# Patient Record
Sex: Male | Born: 1995 | Race: Black or African American | Hispanic: No | Marital: Single | Smoking: Never smoker
Health system: Southern US, Community
[De-identification: ages and names within clinical notes are randomized; demographics above are authoritative.]

## PROBLEM LIST (undated history)

## (undated) DIAGNOSIS — U071 COVID-19: Secondary | ICD-10-CM

## (undated) DIAGNOSIS — T7840XA Allergy, unspecified, initial encounter: Secondary | ICD-10-CM

## (undated) HISTORY — DX: Allergy, unspecified, initial encounter: T78.40XA

---
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
## 2017-03-21 ENCOUNTER — Encounter: Payer: Self-pay | Admitting: Family Medicine

## 2017-03-21 ENCOUNTER — Ambulatory Visit: Payer: Self-pay | Admitting: Family Medicine

## 2017-03-21 VITALS — BP 106/54 | HR 83 | Temp 98.8°F | Resp 17 | Ht 69.5 in | Wt 202.4 lb

## 2017-03-21 DIAGNOSIS — L259 Unspecified contact dermatitis, unspecified cause: Secondary | ICD-10-CM

## 2017-03-21 DIAGNOSIS — Z113 Encounter for screening for infections with a predominantly sexual mode of transmission: Secondary | ICD-10-CM

## 2017-03-21 DIAGNOSIS — Z131 Encounter for screening for diabetes mellitus: Secondary | ICD-10-CM

## 2017-03-21 DIAGNOSIS — Z0000 Encounter for general adult medical examination without abnormal findings: Secondary | ICD-10-CM

## 2017-03-21 DIAGNOSIS — B3742 Candidal balanitis: Secondary | ICD-10-CM

## 2017-03-21 LAB — POCT GLYCOSYLATED HEMOGLOBIN (HGB A1C): Hemoglobin A1C: 5.6

## 2017-03-21 MED ORDER — HYDROCORTISONE 2.5 % EX CREA: TOPICAL_CREAM | Freq: Two times a day (BID) | CUTANEOUS | 0 refills | Status: DC

## 2017-03-21 NOTE — Patient Instructions (Addendum)
   IF you received an x-ray today, you will receive an invoice from [facility]. Please contact [facility] at [phone] with questions or concerns regarding your invoice.   IF you received labwork today, you will receive an invoice from [facility]. Please contact [facility] at [phone] with questions or concerns regarding your invoice.   Our billing staff will not be able to assist you with questions regarding bills from these companies.  You will be contacted with the lab results as soon as they are available. The fastest way to get your results is to activate your My Chart account. Instructions are located on the last page of this paperwork. If you have not heard from us regarding the results in 2 weeks, please contact this office.      Balanitis Balanitis is swelling and irritation (inflammation) of the head of the penis (glans penis). The condition may also cause inflammation of the skin around the glans penis (foreskin) in men who have not been circumcised. It may develop because of an infection or another medical condition. Balanitis occurs most often among men who have not had their foreskin removed (uncircumcised men). Balanitis sometimes causes scarring of the penis or foreskin, which can require surgery. Untreated balanitis can increase the risk of penile cancer. What are the causes? Common causes of this condition include:  Poor personal hygiene, especially in uncircumcised men. Not cleaning the glans penis and foreskin well can result in buildup of bacteria, viruses, and yeast, which can lead to infection and inflammation.  Irritation and lack of air flow due to fluid (smegma) that can build up on the glans penis.  Other causes include:  Chemical irritation from products such as soaps or shower gels (especially those that have fragrance), condoms, personal lubricants, petroleum jelly, spermicides, or fabric softeners.  Skin conditions, such as eczema,  dermatitis, and psoriasis.  Allergies to medicines, such as tetracycline and sulfa drugs.  Certain medical conditions, including liver cirrhosis, congestive heart failure, diabetes, and kidney disease.  Infections, such as candidiasis, HPV (human papillomavirus), herpes simplex, gonorrhea, and syphilis.  Severe obesity.  What increases the risk? The following factors may make you more likely to develop this condition:  Having diabetes. This is the most common risk factor.  Having a tight foreskin that is difficult to pull back (retract) past the glans.  Having sexual intercourse without using a condom.  What are the signs or symptoms? Symptoms of this condition include:  Discharge from under the foreskin.  A bad smell.  Pain or difficulty retracting the foreskin.  Tenderness, redness, and swelling of the glans.  A rash or sores on the glans or foreskin.  Itchiness.  Inability to get an erection due to pain.  Difficulty urinating.  Scarring of the penis or foreskin, in some cases.  How is this diagnosed? This condition may be diagnosed based on:  A physical exam.  Testing a swab of discharge to check for bacterial or fungal infection.  Blood tests: ? To check for viruses that can cause balanitis. ? To check your blood sugar (glucose) level. High blood glucose could be a sign of diabetes, which can cause balanitis.  How is this treated? Treatment for balanitis depends on the cause. Treatment may include:  Improving personal hygiene. Your health care provider may recommend sitting in a bath of warm water that is deep enough to cover your hips and buttocks (sitz bath).  Medicines such as: ? Creams or ointments to reduce swelling (steroids) or   treat an infection. ? Antibiotic medicine. ? Antifungal medicine.  Surgery to remove or cut the foreskin (circumcision). This may be done if you have scarring on the foreskin that makes it difficult to  retract.  Controlling other medical problems that may be causing your condition or making it worse.  Follow these instructions at home:  Do not have sex until the condition clears up, or until your health care provider approves.  Keep your penis clean and dry. Take sitz baths as recommended by your health care provider.  Avoid products that irritate your skin or make symptoms worse, such as soaps and shower gels that have fragrance.  Take over-the-counter and prescription medicines only as told by your health care provider. ? If you were prescribed an antibiotic medicine or a cream or ointment, use it as told by your health care provider. Do not stop using your medicine, cream, or ointment even if you start to feel better. ? Do not drive or use heavy machinery while taking prescription pain medicine. Contact a health care provider if:  Your symptoms get worse or do not improve with home care.  You develop chills or a fever.  You have trouble urinating.  You cannot retract your foreskin. Get help right away if:  You develop severe pain.  You are unable to urinate. Summary  Balanitis is inflammation of the head of the penis (glans penis) caused by irritation or infection.  Balanitis causes pain, redness, and swelling of the glans penis.  This condition is most common among uncircumcised men who do not keep their glans penis clean and in men who have diabetes.  Treatment may include creams or ointments.  Good hygiene is important for prevention. This includes pulling back the foreskin when washing your penis. This information is not intended to replace advice given to you by your health care provider. Make sure you discuss any questions you have with your health care provider. Document Released: 09/17/2008 Document Revised: 03/20/2016 Document Reviewed: 03/20/2016 Elsevier Interactive Patient Education  2017 [employer]  Contact Dermatitis Dermatitis is redness, soreness,  and swelling (inflammation) of the skin. Contact dermatitis is a reaction to certain substances that touch the skin. There are two types of contact dermatitis:  Irritant contact dermatitis. This type is caused by something that irritates your skin, such as dry hands from washing them too much. This type does not require previous exposure to the substance for a reaction to occur. This type is more common.  Allergic contact dermatitis. This type is caused by a substance that you are allergic to, such as a nickel allergy or poison ivy. This type only occurs if you have been exposed to the substance (allergen) before. Upon a repeat exposure, your body reacts to the substance. This type is less common.  What are the causes? Many different substances can cause contact dermatitis. Irritant contact dermatitis is most commonly caused by exposure to:  Makeup.  Soaps.  Detergents.  Bleaches.  Acids.  Metal salts, such as nickel.  Allergic contact dermatitis is most commonly caused by exposure to:  Poisonous plants.  Chemicals.  Jewelry.  Latex.  Medicines.  Preservatives in products, such as clothing.  What increases the risk? This condition is more likely to develop in:  People who have jobs that expose them to irritants or allergens.  People who have certain medical conditions, such as asthma or eczema.  What are the signs or symptoms? Symptoms of this condition may occur anywhere on your body  where the irritant has touched you or is touched by you. Symptoms include:  Dryness or flaking.  Redness.  Cracks.  Itching.  Pain or a burning feeling.  Blisters.  Drainage of small amounts of blood or clear fluid from skin cracks.  With allergic contact dermatitis, there may also be swelling in areas such as the eyelids, mouth, or genitals. How is this diagnosed? This condition is diagnosed with a medical history and physical exam. A patch skin test may be performed to help  determine the cause. If the condition is related to your job, you may need to see an occupational medicine specialist. How is this treated? Treatment for this condition includes figuring out what caused the reaction and protecting your skin from further contact. Treatment may also include:  Steroid creams or ointments. Oral steroid medicines may be needed in more severe cases.  Antibiotics or antibacterial ointments, if a skin infection is present.  Antihistamine lotion or an antihistamine taken by mouth to ease itching.  A bandage (dressing).  Follow these instructions at home: Skin Care  Moisturize your skin as needed.  Apply cool compresses to the affected areas.  Try taking a bath with: ? Epsom salts. Follow the instructions on the packaging. You can get these at your local pharmacy or grocery store. ? Baking soda. Pour a small amount into the bath as directed by your health care provider. ? Colloidal oatmeal. Follow the instructions on the packaging. You can get this at your local pharmacy or grocery store.  Try applying baking soda paste to your skin. Stir water into baking soda until it reaches a paste-like consistency.  Do not scratch your skin.  Bathe less frequently, such as every other day.  Bathe in lukewarm water. Avoid using hot water. Medicines  Take or apply over-the-counter and prescription medicines only as told by your health care provider.  If you were prescribed an antibiotic medicine, take or apply your antibiotic as told by your health care provider. Do not stop using the antibiotic even if your condition starts to improve. General instructions  Keep all follow-up visits as told by your health care provider. This is important.  Avoid the substance that caused your reaction. If you do not know what caused it, keep a journal to try to track what caused it. Write down: ? What you eat. ? What cosmetic products you use. ? What you drink. ? What you wear  in the affected area. This includes jewelry.  If you were given a dressing, take care of it as told by your health care provider. This includes when to change and remove it. Contact a health care provider if:  Your condition does not improve with treatment.  Your condition gets worse.  You have signs of infection such as swelling, tenderness, redness, soreness, or warmth in the affected area.  You have a fever.  You have new symptoms. Get help right away if:  You have a severe headache, neck pain, or neck stiffness.  You vomit.  You feel very sleepy.  You notice red streaks coming from the affected area.  Your bone or joint underneath the affected area becomes painful after the skin has healed.  The affected area turns darker.  You have difficulty breathing. This information is not intended to replace advice given to you by your health care provider. Make sure you discuss any questions you have with your health care provider. Document Released: 04/28/2000 Document Revised: 10/07/2015 Document Reviewed: 09/16/2014 [employer] Interactive  Patient Education  2018 [employer]  

## 2017-03-21 NOTE — Progress Notes (Signed)
Chief Complaint  Patient presents with  . Annual Exam    cpe  . Rash    neck and back x months, has not tried any otc medications for rash  . Edema    head penis around the bottom x 1 week  . family hx of diabetes    wants to be checked for diabetes    Subjective:  Walter Tapia is a 21 y.o. male here for a health maintenance visit.  Patient is new pt  Pt reports that he has increased urination and dry mouth and is concerned about diabetes He reports gaining weight  He also would like std screening  There are no active problems to display for this patient.   Past Medical History:  Diagnosis Date  . Allergy     History reviewed. No pertinent surgical history.   No outpatient medications prior to visit.   No facility-administered medications prior to visit.     No Known Allergies   Family History  Problem Relation Age of Onset  . Diabetes Paternal Grandmother       Social History   Socioeconomic History  . Marital status: Single    Spouse name: Not on file  . Number of children: Not on file  . Years of education: Not on file  . Highest education level: Not on file  Social Needs  . Financial resource strain: Not on file  . Food insecurity - worry: Not on file  . Food insecurity - inability: Not on file  . Transportation needs - medical: Not on file  . Transportation needs - non-medical: Not on file  Occupational History  . Not on file  Tobacco Use  . Smoking status: Never Smoker  . Smokeless tobacco: Never Used  Substance and Sexual Activity  . Alcohol use: No    Frequency: Never  . Drug use: No  . Sexual activity: Yes    Partners: Female    Birth control/protection: Condom  Other Topics Concern  . Not on file  Social History Narrative  . Not on file   Social History   Substance and Sexual Activity  Alcohol Use No  . Frequency: Never   Social History   Tobacco Use  Smoking Status Never Smoker  Smokeless Tobacco Never Used    Social History   Substance and Sexual Activity  Drug Use No    GYN: Sexual Health Menstrual status: regular menses LMP: No LMP for male patient. Last pap smear: see HM section History of abnormal pap smears:  Sexually active: with male partner Current contraception:   Health Maintenance: See under health Maintenance activity for review of completion dates as well.  There is no immunization history on file for this patient.    Depression Screen-PHQ2/9 [facility] screen [facility] 03/21/2017  Decreased Interest 0  Down, Depressed, Hopeless 0  PHQ - 2 Score 0     Depression Severity and Treatment Recommendations:  0-4= None  5-9= Mild / Treatment: Support, educate to call if worse; return in one month  10-14= Moderate / Treatment: Support, watchful waiting; Antidepressant or Psycotherapy  15-19= Moderately severe / Treatment: Antidepressant OR Psychotherapy  >= 20 = Major depression, severe / Antidepressant AND Psychotherapy    Review of Systems   Review of Systems  Constitutional: Negative for chills, fever, malaise/fatigue and weight loss.  Eyes: Negative for blurred vision, double vision, photophobia and pain.  Gastrointestinal: Negative for abdominal pain, nausea and vomiting.  Genitourinary: Negative for dysuria.  Skin:  Positive for itching and rash.  Neurological: Negative for dizziness, tingling and headaches.    All other systems reviewed and were negative   Objective:   Vitals:   03/21/17 1049  BP: (!) 106/54  Pulse: 83  Resp: 17  Temp: 98.8 F (37.1 C)  TempSrc: Oral  SpO2: 98%  Weight: 202 lb 6.4 oz (91.8 kg)  Height: 5' 9.5" (1.765 m)    Body mass index is 29.46 kg/m.  Physical Exam  BP (!) 106/54 (BP Location: Right Arm, Patient Position: Sitting, Cuff Size: Normal)   Pulse 83   Temp 98.8 F (37.1 C) (Oral)   Resp 17   Ht 5' 9.5" (1.765 m)   Wt 202 lb 6.4 oz (91.8 kg)   SpO2 98%   BMI 29.46 kg/m   General Appearance:     Alert, cooperative, no distress, appears stated age  Head:    Normocephalic, without obvious abnormality, atraumatic  Eyes:    PERRL, conjunctiva/corneas clear, EOM's intact, fundi    benign, both eyes       Ears:    Normal TM's and external ear canals, both ears  Nose:   Nares normal, septum midline, mucosa normal, no drainage   or sinus tenderness  Throat:   Lips, mucosa, and tongue normal; teeth and gums normal  Neck:   Supple, symmetrical, trachea midline, no adenopathy;       thyroid:    Lungs:     Clear to auscultation bilaterally, respirations unlabored  Chest wall:    No tenderness or deformity  Heart:    Regular rate and rhythm, S1 and S2 normal, no murmur, rub   or gallop  Abdomen:     Soft, non-tender, bowel sounds active all four quadrants,    no masses, no organomegaly  Genitalia:    Chaperone present. Normal male penis with crusty skin on glans. no discharge or tenderness  Extremities:   Extremities normal, atraumatic, no cyanosis or edema  Pulses:   2+ and symmetric all extremities  Skin:   Skin with hyperpigmentation of the neck and areas of the back  Lymph nodes:   Cervical, supraclavicular, and axillary nodes normal  Neurologic:   CNII-XII intact. Normal strength, sensation and reflexes      throughout       Assessment/Plan:   Patient was seen for a health maintenance exam.  Counseled the patient on health maintenance issues. Reviewed her health mainteance schedule and ordered appropriate tests (see orders.) Counseled on regular exercise and weight management. Recommend regular eye exams and dental cleaning.   The following issues were addressed today for health maintenance:   Walter Tapia was seen today for annual exam, rash, edema and family hx of diabetes.  Diagnoses and all orders for this visit:  Health maintenance examination- age appropriate screenings reviewed  Screening examination for STD (sexually transmitted disease) -     RPR -     GC/Chlamydia  Probe Amp -     HIV antibody  Candidal balanitis  Screening for diabetes mellitus -     POCT glycosylated hemoglobin (Hb A1C)  Contact dermatitis, unspecified contact dermatitis type, unspecified trigger  Other orders -     Cancel: Flu Vaccine QUAD 36+ mos IM -     Cancel: Tdap vaccine greater than or equal to 7yo IM -     hydrocortisone 2.5 % cream; Apply 2 (two) times daily topically.    No Follow-up on file.    Body mass index is 29.46  kg/m.:  Discussed the patient's BMI with patient. The BMI body mass index is 29.46 kg/m.     No future appointments.  Patient Instructions       IF you received an x-ray today, you will receive an invoice from [facility]. Please contact [facility] at [phone] with questions or concerns regarding your invoice.   IF you received labwork today, you will receive an invoice from [facility]. Please contact [facility] at [phone] with questions or concerns regarding your invoice.   Our billing staff will not be able to assist you with questions regarding bills from these companies.  You will be contacted with the lab results as soon as they are available. The fastest way to get your results is to activate your My Chart account. Instructions are located on the last page of this paperwork. If you have not heard from [place] regarding the results in 2 weeks, please contact this office.     Balanitis Balanitis is swelling and irritation (inflammation) of the head of the penis (glans penis). The condition may also cause inflammation of the skin around the glans penis (foreskin) in men who have not been circumcised. It may develop because of an infection or another medical condition. Balanitis occurs most often among men who have not had their foreskin removed (uncircumcised men). Balanitis sometimes causes scarring of the penis or foreskin, which can require surgery. Untreated balanitis can increase the risk of penile  cancer. What are the causes? Common causes of this condition include:  Poor personal hygiene, especially in uncircumcised men. Not cleaning the glans penis and foreskin well can result in buildup of bacteria, viruses, and yeast, which can lead to infection and inflammation.  Irritation and lack of air flow due to fluid (smegma) that can build up on the glans penis.  Other causes include:  Chemical irritation from products such as soaps or shower gels (especially those that have fragrance), condoms, personal lubricants, petroleum jelly, spermicides, or fabric softeners.  Skin conditions, such as eczema, dermatitis, and psoriasis.  Allergies to medicines, such as tetracycline and sulfa drugs.  Certain medical conditions, including liver cirrhosis, congestive heart failure, diabetes, and kidney disease.  Infections, such as candidiasis, HPV (human papillomavirus), herpes simplex, gonorrhea, and syphilis.  Severe obesity.  What increases the risk? The following factors may make you more likely to develop this condition:  Having diabetes. This is the most common risk factor.  Having a tight foreskin that is difficult to pull back (retract) past the glans.  Having sexual intercourse without using a condom.  What are the signs or symptoms? Symptoms of this condition include:  Discharge from under the foreskin.  A bad smell.  Pain or difficulty retracting the foreskin.  Tenderness, redness, and swelling of the glans.  A rash or sores on the glans or foreskin.  Itchiness.  Inability to get an erection due to pain.  Difficulty urinating.  Scarring of the penis or foreskin, in some cases.  How is this diagnosed? This condition may be diagnosed based on:  A physical exam.  Testing a swab of discharge to check for bacterial or fungal infection.  Blood tests: ? To check for viruses that can cause balanitis. ? To check your blood sugar (glucose) level. High blood glucose  could be a sign of diabetes, which can cause balanitis.  How is this treated? Treatment for balanitis depends on the cause. Treatment may include:  Improving personal hygiene. Your health care provider may recommend sitting in a  bath of warm water that is deep enough to cover your hips and buttocks (sitz bath).  Medicines such as: ? Creams or ointments to reduce swelling (steroids) or to treat an infection. ? Antibiotic medicine. ? Antifungal medicine.  Surgery to remove or cut the foreskin (circumcision). This may be done if you have scarring on the foreskin that makes it difficult to retract.  Controlling other medical problems that may be causing your condition or making it worse.  Follow these instructions at home:  Do not have sex until the condition clears up, or until your health care provider approves.  Keep your penis clean and dry. Take sitz baths as recommended by your health care provider.  Avoid products that irritate your skin or make symptoms worse, such as soaps and shower gels that have fragrance.  Take over-the-counter and prescription medicines only as told by your health care provider. ? If you were prescribed an antibiotic medicine or a cream or ointment, use it as told by your health care provider. Do not stop using your medicine, cream, or ointment even if you start to feel better. ? Do not drive or use heavy machinery while taking prescription pain medicine. Contact a health care provider if:  Your symptoms get worse or do not improve with home care.  You develop chills or a fever.  You have trouble urinating.  You cannot retract your foreskin. Get help right away if:  You develop severe pain.  You are unable to urinate. Summary  Balanitis is inflammation of the head of the penis (glans penis) caused by irritation or infection.  Balanitis causes pain, redness, and swelling of the glans penis.  This condition is most common among uncircumcised men  who do not keep their glans penis clean and in men who have diabetes.  Treatment may include creams or ointments.  Good hygiene is important for prevention. This includes pulling back the foreskin when washing your penis. This information is not intended to replace advice given to you by your health care provider. Make sure you discuss any questions you have with your health care provider. Document Released: 09/17/2008 Document Revised: 03/20/2016 Document Reviewed: 03/20/2016 Elsevier Interactive Patient Education  2017 [employer]  Contact Dermatitis Dermatitis is redness, soreness, and swelling (inflammation) of the skin. Contact dermatitis is a reaction to certain substances that touch the skin. There are two types of contact dermatitis:  Irritant contact dermatitis. This type is caused by something that irritates your skin, such as dry hands from washing them too much. This type does not require previous exposure to the substance for a reaction to occur. This type is more common.  Allergic contact dermatitis. This type is caused by a substance that you are allergic to, such as a nickel allergy or poison ivy. This type only occurs if you have been exposed to the substance (allergen) before. Upon a repeat exposure, your body reacts to the substance. This type is less common.  What are the causes? Many different substances can cause contact dermatitis. Irritant contact dermatitis is most commonly caused by exposure to:  Makeup.  Soaps.  Detergents.  Bleaches.  Acids.  Metal salts, such as nickel.  Allergic contact dermatitis is most commonly caused by exposure to:  Poisonous plants.  Chemicals.  Jewelry.  Latex.  Medicines.  Preservatives in products, such as clothing.  What increases the risk? This condition is more likely to develop in:  People who have jobs that expose them to irritants or allergens.  People who have certain medical conditions, such as asthma  or eczema.  What are the signs or symptoms? Symptoms of this condition may occur anywhere on your body where the irritant has touched you or is touched by you. Symptoms include:  Dryness or flaking.  Redness.  Cracks.  Itching.  Pain or a burning feeling.  Blisters.  Drainage of small amounts of blood or clear fluid from skin cracks.  With allergic contact dermatitis, there may also be swelling in areas such as the eyelids, mouth, or genitals. How is this diagnosed? This condition is diagnosed with a medical history and physical exam. A patch skin test may be performed to help determine the cause. If the condition is related to your job, you may need to see an occupational medicine specialist. How is this treated? Treatment for this condition includes figuring out what caused the reaction and protecting your skin from further contact. Treatment may also include:  Steroid creams or ointments. Oral steroid medicines may be needed in more severe cases.  Antibiotics or antibacterial ointments, if a skin infection is present.  Antihistamine lotion or an antihistamine taken by mouth to ease itching.  A bandage (dressing).  Follow these instructions at home: Skin Care  Moisturize your skin as needed.  Apply cool compresses to the affected areas.  Try taking a bath with: ? Epsom salts. Follow the instructions on the packaging. You can get these at your local pharmacy or grocery store. ? Baking soda. Pour a small amount into the bath as directed by your health care provider. ? Colloidal oatmeal. Follow the instructions on the packaging. You can get this at your local pharmacy or grocery store.  Try applying baking soda paste to your skin. Stir water into baking soda until it reaches a paste-like consistency.  Do not scratch your skin.  Bathe less frequently, such as every other day.  Bathe in lukewarm water. Avoid using hot water. Medicines  Take or apply over-the-counter  and prescription medicines only as told by your health care provider.  If you were prescribed an antibiotic medicine, take or apply your antibiotic as told by your health care provider. Do not stop using the antibiotic even if your condition starts to improve. General instructions  Keep all follow-up visits as told by your health care provider. This is important.  Avoid the substance that caused your reaction. If you do not know what caused it, keep a journal to try to track what caused it. Write down: ? What you eat. ? What cosmetic products you use. ? What you drink. ? What you wear in the affected area. This includes jewelry.  If you were given a dressing, take care of it as told by your health care provider. This includes when to change and remove it. Contact a health care provider if:  Your condition does not improve with treatment.  Your condition gets worse.  You have signs of infection such as swelling, tenderness, redness, soreness, or warmth in the affected area.  You have a fever.  You have new symptoms. Get help right away if:  You have a severe headache, neck pain, or neck stiffness.  You vomit.  You feel very sleepy.  You notice red streaks coming from the affected area.  Your bone or joint underneath the affected area becomes painful after the skin has healed.  The affected area turns darker.  You have difficulty breathing. This information is not intended to replace advice given to you by  your health care provider. Make sure you discuss any questions you have with your health care provider. Document Released: 04/28/2000 Document Revised: 10/07/2015 Document Reviewed: 09/16/2014 Elsevier Interactive Patient Education  2018 [employer].

## 2017-03-22 LAB — RPR: RPR: NONREACTIVE

## 2017-03-22 LAB — HIV ANTIBODY (ROUTINE TESTING W REFLEX): HIV SCREEN 4TH GENERATION: NONREACTIVE

## 2017-03-23 LAB — GC/CHLAMYDIA PROBE AMP
CHLAMYDIA, DNA PROBE: NEGATIVE
NEISSERIA GONORRHOEAE BY PCR: NEGATIVE

## 2017-07-02 ENCOUNTER — Encounter: Payer: Self-pay | Admitting: Family Medicine

## 2017-08-09 ENCOUNTER — Telehealth: Payer: Self-pay | Admitting: Family Medicine

## 2017-08-09 MED ORDER — HYDROCORTISONE 2.5 % EX CREA: TOPICAL_CREAM | Freq: Two times a day (BID) | CUTANEOUS | 0 refills | Status: DC

## 2017-08-09 NOTE — Telephone Encounter (Signed)
Copied from CRM [phone]. Topic: Quick Communication - Rx Refill/Question >> Aug 09, 2017 12:03 PM Jonette EvaBarksdale, Harvey B wrote: Pt states he still has the condition prescribed for the cream   Medication: hydrocortisone 2.5 % cream [[id number]][[id number]] Has the patient contacted their pharmacy? No. (Agent: If no, request that the patient contact the pharmacy for the refill.) Preferred Pharmacy (with phone number or street name): [facility] Agent: Please be advised that RX refills may take up to 3 business days. We ask that you follow-up with your pharmacy.

## 2017-08-09 NOTE — Telephone Encounter (Signed)
Hydrocortisone 2.5 % refill Last OV: 03/31/17 Last Refill:03/31/17 Pharmacy:[facility] [address] [place] PCP: Collie SiadZoe Stallings MD

## 2018-09-17 ENCOUNTER — Emergency Department (HOSPITAL_COMMUNITY)
Admission: EM | Admit: 2018-09-17 | Discharge: 2018-09-17 | Disposition: A | Discharge status: Home / Self Care | Payer: Self-pay | Attending: Emergency Medicine | Admitting: Emergency Medicine

## 2018-09-17 ENCOUNTER — Other Ambulatory Visit: Payer: Self-pay

## 2018-09-17 ENCOUNTER — Encounter (HOSPITAL_COMMUNITY): Payer: Self-pay | Admitting: Emergency Medicine

## 2018-09-17 ENCOUNTER — Emergency Department (HOSPITAL_COMMUNITY): Payer: Self-pay

## 2018-09-17 DIAGNOSIS — R0789 Other chest pain: Secondary | ICD-10-CM | POA: Insufficient documentation

## 2018-09-17 LAB — CBC
HCT: 42.3 % (ref 39.0–52.0)
Hemoglobin: 14.3 g/dL (ref 13.0–17.0)
MCH: 30.2 pg (ref 26.0–34.0)
MCHC: 33.8 g/dL (ref 30.0–36.0)
MCV: 89.4 fL (ref 80.0–100.0)
Platelets: 294 10*3/uL (ref 150–400)
RBC: 4.73 MIL/uL (ref 4.22–5.81)
RDW: 11.7 % (ref 11.5–15.5)
WBC: 12.7 10*3/uL — ABNORMAL HIGH (ref 4.0–10.5)
nRBC: 0 % (ref 0.0–0.2)

## 2018-09-17 LAB — TROPONIN I: Troponin I: <0.03 ng/mL (ref <0.03)

## 2018-09-17 LAB — BASIC METABOLIC PANEL
Anion gap: 10 (ref 5–15)
BUN: 12 mg/dL (ref 6–20)
CO2: 26 mmol/L (ref 22–32)
Calcium: 9.6 mg/dL (ref 8.9–10.3)
Chloride: 100 mmol/L (ref 98–111)
Creatinine, Ser: 0.98 mg/dL (ref 0.61–1.24)
GFR calc Af Amer: >60 mL/min (ref >60)
GFR calc non Af Amer: >60 mL/min (ref >60)
Glucose, Bld: 139 mg/dL — ABNORMAL HIGH (ref 70–99)
Potassium: 3.7 mmol/L (ref 3.5–5.1)
Sodium: 136 mmol/L (ref 135–145)

## 2018-09-17 MED ORDER — NAPROXEN 500 MG PO TABS: ORAL_TABLET | ORAL | 0 refills | Status: DC

## 2018-09-17 MED ORDER — HYDROCODONE-ACETAMINOPHEN 5-325 MG PO TABS
1.0000 | ORAL_TABLET | Freq: Once | ORAL | Status: AC
  Administered 2018-09-17: 1 via ORAL
  Filled 2018-09-17: qty 1

## 2018-09-17 MED ORDER — SODIUM CHLORIDE 0.9% FLUSH
3.0000 mL | Freq: Once | INTRAVENOUS | Status: AC
  Administered 2018-09-17: 3 mL via INTRAVENOUS

## 2018-09-17 NOTE — ED Triage Notes (Signed)
Pt state he started having chest pain about an hour ago after getting off of work. Pt states he could be having a panic attach. Pt states his chest pain feels like pressure.

## 2018-09-17 NOTE — ED Provider Notes (Signed)
[facility]ENCY DEPARTMENT Provider Note   CSN: [id number] Arrival date & time: 09/17/18  0135    History   Chief Complaint Chief Complaint  Patient presents with  . Chest Pain    HPI Walter Tapia is a 23 y.o. male.     Patient complains of right-sided chest discomfort  The history is provided by the patient. No language interpreter was used.  Chest Pain  Pain location:  R chest Pain radiates to:  Does not radiate Pain severity:  Moderate Onset quality:  Sudden Timing:  Constant Progression:  Worsening Chronicity:  New Context: not breathing   Relieved by:  Nothing Worsened by:  Nothing Associated symptoms: no abdominal pain, no back pain, no cough, no fatigue and no headache     Past Medical History:  Diagnosis Date  . Allergy     There are no active problems to display for this patient.   History reviewed. No pertinent surgical history.      Home Medications    Prior to Admission medications   Medication Sig Start Date End Date Taking? Authorizing Provider  hydrocortisone 2.5 % cream Apply topically 2 (two) times daily. 08/09/17   Doristine BosworthStallings, Zoe A, MD  naproxen (NAPROSYN) 500 MG tablet Take 1 pill twice a day as needed for pain 09/17/18   Bethann BerkshireZammit, Colston Pyle, MD    Family History Family History  Problem Relation Age of Onset  . Diabetes Paternal Grandmother     Social History Social History   Tobacco Use  . Smoking status: Never Smoker  . Smokeless tobacco: Never Used  Substance Use Topics  . Alcohol use: No    Frequency: Never  . Drug use: No     Allergies   Patient has no known allergies.   Review of Systems Review of Systems  Constitutional: Negative for appetite change and fatigue.  HENT: Negative for congestion, ear discharge and sinus pressure.   Eyes: Negative for discharge.  Respiratory: Negative for cough.   Cardiovascular: Positive for chest pain.  Gastrointestinal: Negative for abdominal pain and diarrhea.   Genitourinary: Negative for frequency and hematuria.  Musculoskeletal: Negative for back pain.  Skin: Negative for rash.  Neurological: Negative for seizures and headaches.  Psychiatric/Behavioral: Negative for hallucinations.     Physical Exam Updated Vital Signs BP (!) 121/53   Pulse 78   Temp 99 F (37.2 C) (Oral)   Resp 19   Ht 5\' 10"  (1.778 m)   Wt 88 kg   SpO2 98%   BMI 27.84 kg/m   Physical Exam Vitals signs and nursing note reviewed.  Constitutional:      Appearance: He is well-developed.  HENT:     Head: Normocephalic.     Nose: Nose normal.     Mouth/Throat:     Mouth: Mucous membranes are moist.  Eyes:     General: No scleral icterus.    Conjunctiva/sclera: Conjunctivae normal.  Neck:     Musculoskeletal: Neck supple.     Thyroid: No thyromegaly.  Cardiovascular:     Rate and Rhythm: Normal rate and regular rhythm.     Heart sounds: No murmur. No friction rub. No gallop.   Pulmonary:     Breath sounds: No stridor. No wheezing or rales.  Chest:     Chest wall: Tenderness present.  Abdominal:     General: There is no distension.     Tenderness: There is no abdominal tenderness. There is no rebound.  Musculoskeletal: Normal range of  motion.  Lymphadenopathy:     Cervical: No cervical adenopathy.  Skin:    Findings: No erythema or rash.  Neurological:     Mental Status: He is oriented to person, place, and time.     Motor: No abnormal muscle tone.     Coordination: Coordination normal.  Psychiatric:        Behavior: Behavior normal.      ED Treatments / Results  Labs (all labs ordered are listed, but only abnormal results are displayed) Labs Reviewed  BASIC METABOLIC PANEL - Abnormal; Notable for the following components:      Result Value   Glucose, Bld 139 (*)    All other components within normal limits  CBC - Abnormal; Notable for the following components:   WBC 12.7 (*)    All other components within normal limits  TROPONIN I     EKG None  Radiology Dg Chest 2 View  Result Date: 09/17/2018 CLINICAL DATA:  23 y/o M; right-sided chest pain that began after work [employer]. Occasional cough. EXAM: CHEST - 2 VIEW COMPARISON:  10/30/2005 FINDINGS: Stable heart size and mediastinal contours are within normal limits. Both lungs are clear. The visualized skeletal structures are unremarkable. IMPRESSION: No active cardiopulmonary disease. Electronically Signed   By: Mitzi Hansen M.D.   On: 09/17/2018 02:27    Procedures Procedures (including critical care time)  Medications Ordered in ED Medications  sodium chloride flush (NS) 0.9 % injection 3 mL (3 mLs Intravenous Given 09/17/18 0204)  HYDROcodone-acetaminophen (NORCO/VICODIN) 5-325 MG per tablet 1 tablet (1 tablet Oral Given 09/17/18 0226)     Initial Impression / Assessment and Plan / ED Course  I have reviewed the triage vital signs and the nursing notes.  Pertinent labs & imaging results that were available during my care of the patient were reviewed by me and considered in my medical decision making (see chart for details).        Labs and EKG chest x-ray unremarkable.  Suspect musculoskeletal pain.  Patient given Naprosyn will follow-up as needed  Final Clinical Impressions(s) / ED Diagnoses   Final diagnoses:  Atypical chest pain    ED Discharge Orders         Ordered    naproxen (NAPROSYN) 500 MG tablet     09/17/18 0258           Bethann Berkshire, MD 09/17/18 0301

## 2018-09-17 NOTE — Discharge Instructions (Addendum)
Follow up next week if not improving. °

## 2018-09-30 ENCOUNTER — Ambulatory Visit
Admission: EM | Admit: 2018-09-30 | Discharge: 2018-09-30 | Disposition: A | Discharge status: Home / Self Care | Payer: Self-pay | Attending: Emergency Medicine | Admitting: Emergency Medicine

## 2018-09-30 ENCOUNTER — Other Ambulatory Visit: Payer: Self-pay

## 2018-09-30 DIAGNOSIS — N481 Balanitis: Secondary | ICD-10-CM

## 2018-09-30 DIAGNOSIS — R0789 Other chest pain: Secondary | ICD-10-CM

## 2018-09-30 MED ORDER — KETOROLAC TROMETHAMINE 60 MG/2ML IM SOLN
60.0000 mg | Freq: Once | INTRAMUSCULAR | Status: AC
  Administered 2018-09-30: 60 mg via INTRAMUSCULAR

## 2018-09-30 MED ORDER — HYDROCORTISONE 2.5 % EX CREA: TOPICAL_CREAM | Freq: Two times a day (BID) | CUTANEOUS | 0 refills | Status: DC

## 2018-09-30 MED ORDER — MELOXICAM 7.5 MG PO TABS: 7.5000 mg | ORAL_TABLET | Freq: Every day | ORAL | 0 refills | Status: DC

## 2018-09-30 NOTE — ED Provider Notes (Signed)
[facility]   [id number] 09/30/18 Arrival Time: 1243   CC: CHEST discomfort  SUBJECTIVE:  Grant RutsDamahli N Deleeuw is a 23 y.o. male who presents with complaint of abrupt chest discomfort that began 2 weeks ago.  Denies a precipitating event, trauma, recent lower respiratory tract, or strenuous upper body activities.  Localizes chest discomfort to the substernal region.  Describes as stable, that is constant and achy in character.  Rates pain as 7/10.   Has tried naproxen and z-pak without relief.  Symptoms made worse with deep breath, and pectoralis stretch.  Denies radiating symptoms.  Denies previous symptoms in the past.  Mother mentioned concern for anxiety, but patient denies anxiety, or recent stressors.  Denies fever, chills, lightheadedness, dizziness, palpitations, tachycardia, SOB, nausea, vomiting, abdominal pain, changes in bowel or bladder habits, diaphoresis, numbness/tingling in extremities, peripheral edema.    Denies SOB, calf pain or swelling, recent long travel, recent surgery, malignancy, tobacco use, hormone use, or previous blood clot  Denies close relatives with cardiac hx  Requests hydrocortisone cream refill for balanitis.    ROS: As per HPI. Past Medical History:  Diagnosis Date  . Allergy    History reviewed. No pertinent surgical history. No Known Allergies No current facility-administered medications on file prior to encounter.    No current outpatient medications on file prior to encounter.   Social History   Socioeconomic History  . Marital status: Single    Spouse name: Not on file  . Number of children: Not on file  . Years of education: Not on file  . Highest education level: Not on file  Occupational History  . Not on file  Social Needs  . Financial resource strain: Not on file  . Food insecurity:    Worry: Not on file    Inability: Not on file  . Transportation needs:    Medical: Not on file    Non-medical: Not on file  Tobacco  Use  . Smoking status: Never Smoker  . Smokeless tobacco: Never Used  Substance and Sexual Activity  . Alcohol use: No    Frequency: Never  . Drug use: No  . Sexual activity: Yes    Partners: Female    Birth control/protection: Condom  Lifestyle  . Physical activity:    Days per week: Not on file    Minutes per session: Not on file  . Stress: Not on file  Relationships  . Social connections:    Talks on phone: Not on file    Gets together: Not on file    Attends religious service: Not on file    Active member of club or organization: Not on file    Attends meetings of clubs or organizations: Not on file    Relationship status: Not on file  . Intimate partner violence:    Fear of current or ex partner: Not on file    Emotionally abused: Not on file    Physically abused: Not on file    Forced sexual activity: Not on file  Other Topics Concern  . Not on file  Social History Narrative  . Not on file   Family History  Problem Relation Age of Onset  . Diabetes Paternal Grandmother      OBJECTIVE:  Vitals:   09/30/18 1252  BP: 134/83  Pulse: 75  Resp: 18  Temp: 99.4 F (37.4 C)  TempSrc: Oral  SpO2: 97%    General appearance: alert; no distress Eyes: PERRLA; EOMI; conjunctiva normal HENT:  normocephalic; atraumatic; EACs mildly ceruminous, TMs pearly gray; nares patent without rhinorrhea; oropharynx clear, tonsils not enlarged or erythematous, uvula midline Neck: supple Lungs: clear to auscultation bilaterally without adventitious breath sounds Heart: regular rate and rhythm.  Clear S1 and S2 without rubs, gallops, or murmur. Chest Wall: No heaves, lifts or thrills; TTP over sternum, and with AP compression of chest Abdomen: soft, non-tender; bowel sounds normal; no guarding Extremities: no cyanosis or edema; symmetrical with no gross deformities Skin: warm and dry Psychological: alert and cooperative; normal mood and flat affect  Dg Chest 2 View  Result Date:  09/17/2018 CLINICAL DATA:  23 y/o M; right-sided chest pain that began after work [employer]. Occasional cough. EXAM: CHEST - 2 VIEW COMPARISON:  10/30/2005 FINDINGS: Stable heart size and mediastinal contours are within normal limits. Both lungs are clear. The visualized skeletal structures are unremarkable. IMPRESSION: No active cardiopulmonary disease. Electronically Signed   By: Mitzi Hansen M.D.   On: 09/17/2018 02:27   ASSESSMENT & PLAN:  1. Chest wall pain   2. Balanitis     Meds ordered this encounter  Medications  . ketorolac (TORADOL) injection 60 mg  . hydrocortisone 2.5 % cream    Sig: Apply topically 2 (two) times daily.    Dispense:  30 g    Refill:  0    Order Specific Question:   Supervising Provider    Answer:   Eustace Moore [[id number]]  . meloxicam (MOBIC) 7.5 MG tablet    Sig: Take 1 tablet (7.5 mg total) by mouth daily.    Dispense:  30 tablet    Refill:  0    Order Specific Question:   Supervising Provider    Answer:   Eustace Moore [[id number]]    Chest x-ray and EKG without abnormal findings on 09/17/2018 Declines repeat chest x-ray today Declines further work-up in the ED at this time.  Patient aware we do not have EKG in office, and are unable to rule out cardiac cause at this time. Would like to try toradol shot in office, and prescription for mobic. Mobic prescribed.  Take as directed.  DO NOT TAKE WITH other antiinflammatories as this may cause GI upset and/or bleed Please make an appointment with a local PCP for further evaluation and management of chest discomfort Return or go to the ED if you have any new or worsening symptoms such as fever, chills, nausea, vomiting, worsening chest discomfort, shortness of breath, lightheadedness, dizziness, vision changes, headaches, abdominal pain, changes in bowel or bladder function, numbness or tingling in arms or legs, weakness in arms or legs, slurred speech, facial droop, etc...    Hydrocortisone  cream refilled.    Chest pain precautions given. Reviewed expectations re: course of current medical issues. Questions answered. Outlined signs and symptoms indicating need for more acute intervention. Patient verbalized understanding. After Visit Summary given.   Rennis Harding, PA-C 09/30/18 1329

## 2018-09-30 NOTE — Discharge Instructions (Addendum)
Chest x-ray and EKG without abnormal findings on 09/17/2018 Declines repeat chest x-ray today Declines further work-up in the ED at this time.  Patient aware we do not have EKG in office, and are unable to rule out cardiac cause at this time. Would like to try toradol shot in office, and prescription for mobic. Mobic prescribed.  Take as directed.  DO NOT TAKE WITH other antiinflammatories as this may cause GI upset and/or bleed Please make an appointment with a local PCP for further evaluation and management of chest discomfort Return or go to the ED if you have any new or worsening symptoms such as fever, chills, nausea, vomiting, worsening chest discomfort, shortness of breath, lightheadedness, dizziness, vision changes, headaches, abdominal pain, changes in bowel or bladder function, numbness or tingling in arms or legs, weakness in arms or legs, slurred speech, facial droop, etc...    Hydrocortisone cream refilled.

## 2018-09-30 NOTE — ED Triage Notes (Signed)
Pt states he has had ongoing upper chest pain, was also seen at urgent care on [address] last week and has just completed z pack

## 2018-11-21 ENCOUNTER — Emergency Department (HOSPITAL_COMMUNITY)
Admission: EM | Admit: 2018-11-21 | Discharge: 2018-11-21 | Disposition: A | Discharge status: Home / Self Care | Payer: BC Managed Care – PPO | Attending: Emergency Medicine | Admitting: Emergency Medicine

## 2018-11-21 ENCOUNTER — Other Ambulatory Visit: Payer: Self-pay

## 2018-11-21 ENCOUNTER — Emergency Department (HOSPITAL_COMMUNITY): Payer: BC Managed Care – PPO

## 2018-11-21 DIAGNOSIS — R079 Chest pain, unspecified: Secondary | ICD-10-CM | POA: Diagnosis present

## 2018-11-21 DIAGNOSIS — Z79899 Other long term (current) drug therapy: Secondary | ICD-10-CM | POA: Diagnosis not present

## 2018-11-21 LAB — TROPONIN I (HIGH SENSITIVITY)
Troponin I (High Sensitivity): <2 ng/L (ref <18)
Troponin I (High Sensitivity): <2 ng/L (ref <18)

## 2018-11-21 NOTE — ED Triage Notes (Signed)
Pt here with c/o center chest pain with cough , no fever no n/v/sob

## 2018-11-21 NOTE — ED Notes (Signed)
Pt given discharge instructions, follow up information, and the opportunity to ask questions. Pt verbalized understanding. Pt discharged from the ED without incident.

## 2018-11-21 NOTE — ED Provider Notes (Signed)
[facility] EMERGENCY DEPARTMENT Provider Note   CSN: [id number] Arrival date & time: 11/21/18  1032    History   Chief Complaint No chief complaint on file.   HPI Walter Tapia is a 23 y.o. male.     HPI    23 year old male presents today with complaints of chest pain.  Patient notes undetermined time between months and weeks of left sharp chest pain.  He notes this is not present every day but has been present over the last several days consistently.  He notes the pain slightly radiates to his left back.  He notes symptoms are not worsened with deep inspiration, palpation or movement.  He denies any associated shortness of breath, fever, cough.  He denies any personal cardiac or pulmonary history.  He denies any family cardiac history.  He is not a smoker.  No history DVT or PE or any significant risk factors for this.  Medications prior to arrival.  No lower extremity swelling or edema.   Past Medical History:  Diagnosis Date  . Allergy     There are no active problems to display for this patient.   No past surgical history on file.     Home Medications    Prior to Admission medications   Medication Sig Start Date End Date Taking? Authorizing Provider  levocetirizine (XYZAL) 5 MG tablet Take 5 mg by mouth every evening.   Yes [provider]  hydrocortisone 2.5 % cream Apply topically 2 (two) times daily. Patient not taking: Reported on 11/21/2018 09/30/18   Wurst, [place], PA-C  meloxicam (MOBIC) 7.5 MG tablet Take 1 tablet (7.5 mg total) by mouth daily. Patient not taking: Reported on 11/21/2018 09/30/18   Lestine Box, PA-C    Family History Family History  Problem Relation Age of Onset  . Diabetes Paternal Grandmother     Social History Social History   Tobacco Use  . Smoking status: Never Smoker  . Smokeless tobacco: Never Used  Substance Use Topics  . Alcohol use: No    Frequency: Never  . Drug use: No     Allergies    Patient has no known allergies.   Review of Systems Review of Systems  All other systems reviewed and are negative.   Physical Exam Updated Vital Signs BP 131/75   Pulse (!) 57   Temp 98.8 F (37.1 C) (Oral)   Resp 16   SpO2 100%   Physical Exam Vitals signs and nursing note reviewed.  Constitutional:      Appearance: He is well-developed.  HENT:     Head: Normocephalic and atraumatic.  Eyes:     General: No scleral icterus.       Right eye: No discharge.        Left eye: No discharge.     Conjunctiva/sclera: Conjunctivae normal.     Pupils: Pupils are equal, round, and reactive to light.  Neck:     Musculoskeletal: Normal range of motion.     Vascular: No JVD.     Trachea: No tracheal deviation.  Cardiovascular:     Rate and Rhythm: Normal rate and regular rhythm.  Pulmonary:     Effort: Pulmonary effort is normal. No respiratory distress.     Breath sounds: Normal breath sounds. No stridor. No wheezing, rhonchi or rales.  Chest:     Chest wall: No tenderness.  Musculoskeletal:     Comments: No edema  Neurological:     Mental Status: He  is alert and oriented to person, place, and time.     Coordination: Coordination normal.  Psychiatric:        Behavior: Behavior normal.        Thought Content: Thought content normal.        Judgment: Judgment normal.     ED Treatments / Results  Labs (all labs ordered are listed, but only abnormal results are displayed) Labs Reviewed  TROPONIN I (HIGH SENSITIVITY)  TROPONIN I (HIGH SENSITIVITY)    EKG EKG Interpretation  Date/Time:  Thursday November 21 2018 10:45:34 EDT Ventricular Rate:  74 PR Interval:  146 QRS Duration: 102 QT Interval:  368 QTC Calculation: 408 R Axis:   35 Text Interpretation:  Normal sinus rhythm RSR' or QR pattern in V1 suggests right ventricular conduction delay Borderline ECG When compared to prior, no sighnificnat changes seen.  NO STEMI Confirmed by Theda Belfastegeler, Chris ([id number]) on 11/21/2018  12:44:59 PM   Radiology No results found.  Procedures Procedures (including critical care time)  Medications Ordered in ED Medications - No data to display   Initial Impression / Assessment and Plan / ED Course  I have reviewed the triage vital signs and the nursing notes.  Pertinent labs & imaging results that were available during my care of the patient were reviewed by me and considered in my medical decision making (see chart for details).          Assessment/Plan: 23 year old male presents today with nonspecific chest pain.  His etiology is unclear I have low suspicion for ACS.  I will evaluate with EKG chest x-ray and troponin.  His EKG shows no significant abnormalities consistent with ACS.  He has no signs signs of cardiac dysfunction, no signs of infectious etiology.  Patient referred to cardiology for outpatient evaluation, he is given strict return precautions, he verbalized understanding and agreement to today's plan had no further questions or concerns at time of discharge.   Final Clinical Impressions(s) / ED Diagnoses   Final diagnoses:  Chest pain, unspecified type    ED Discharge Orders    None       Rosalio LoudHedges, Ingrid Shifrin, PA-C 11/25/18 1932    Tegeler, Canary Brimhristopher J, MD 11/26/18 [phone]

## 2018-11-21 NOTE — ED Notes (Signed)
Patient transported to X-ray 

## 2018-11-21 NOTE — Discharge Instructions (Addendum)
Please read attached information. If you experience any new or worsening signs or symptoms please return to the emergency room for evaluation. Please follow-up with your primary care provider or specialist as discussed.  °

## 2018-11-28 ENCOUNTER — Other Ambulatory Visit: Payer: Self-pay

## 2018-11-28 ENCOUNTER — Other Ambulatory Visit: Payer: BC Managed Care – PPO

## 2018-11-28 DIAGNOSIS — Z20822 Contact with and (suspected) exposure to covid-19: Secondary | ICD-10-CM

## 2018-12-03 LAB — NOVEL CORONAVIRUS, NAA: SARS-CoV-2, NAA: NOT DETECTED

## 2018-12-04 ENCOUNTER — Telehealth: Payer: Self-pay | Admitting: General Practice

## 2018-12-04 NOTE — Telephone Encounter (Signed)
Patient called in for the results of his Covid-19 test.  He was told Covid was Not Detected. °

## 2019-05-02 ENCOUNTER — Ambulatory Visit
Admission: EM | Admit: 2019-05-02 | Discharge: 2019-05-02 | Disposition: A | Discharge status: Home / Self Care | Payer: BC Managed Care – PPO | Attending: Emergency Medicine | Admitting: Emergency Medicine

## 2019-05-02 DIAGNOSIS — U071 COVID-19: Secondary | ICD-10-CM | POA: Diagnosis not present

## 2019-05-02 LAB — POC SARS CORONAVIRUS 2 AG -  ED: SARS Coronavirus 2 Ag: POSITIVE — AB

## 2019-05-02 NOTE — ED Triage Notes (Signed)
Pt here for covid test after positive exposure 2 days ago at work

## 2019-05-02 NOTE — ED Provider Notes (Signed)
[facility]    CSN: [id number] Arrival date & time: 05/02/19  1745      History   Chief Complaint No chief complaint on file.   HPI Walter Tapia is a 23 y.o. male.   Walter Tapia 23 years old male presented to the urgent care with complaint of Covid exposure.  He was exposed to Covid positive person at work 2 days ago.  Denies sick exposure to flu or strep.  Denies recent travel.  Denies aggravating or alleviating symptoms.  Denies previous COVID infection.   Denies fever, chills, fatigue, nasal congestion, rhinorrhea, sore throat, cough, SOB, wheezing, chest pain, nausea, vomiting, changes in bowel or bladder habits.    The history is provided by the patient. No language interpreter was used.    Past Medical History:  Diagnosis Date  . Allergy     There are no problems to display for this patient.   History reviewed. No pertinent surgical history.     Home Medications    Prior to Admission medications   Medication Sig Start Date End Date Taking? Authorizing Provider  hydrocortisone 2.5 % cream Apply topically 2 (two) times daily. Patient not taking: Reported on 11/21/2018 09/30/18   Wurst, [place], PA-C  levocetirizine (XYZAL) 5 MG tablet Take 5 mg by mouth every evening.    [provider]  meloxicam (MOBIC) 7.5 MG tablet Take 1 tablet (7.5 mg total) by mouth daily. Patient not taking: Reported on 11/21/2018 09/30/18   Lestine Box, PA-C    Family History Family History  Problem Relation Age of Onset  . Diabetes Paternal Grandmother     Social History Social History   Tobacco Use  . Smoking status: Never Smoker  . Smokeless tobacco: Never Used  Substance Use Topics  . Alcohol use: No  . Drug use: No     Allergies   Patient has no known allergies.   Review of Systems Review of Systems  Constitutional: Negative.   HENT: Negative.   Respiratory: Negative.   Cardiovascular: Negative.   Gastrointestinal: Negative.     Neurological: Negative.   ROS: All other are negatives   Physical Exam Triage Vital Signs ED Triage Vitals  Enc Vitals Group     BP      Pulse      Resp      Temp      Temp src      SpO2      Weight      Height      Head Circumference      Peak Flow      Pain Score      Pain Loc      Pain Edu?      Excl. in [place]?    No data found.  Updated Vital Signs BP 128/75   Pulse 99   Temp (!) 100.7 F (38.2 C)   Resp 18   SpO2 93%   Visual Acuity Right Eye Distance:   Left Eye Distance:   Bilateral Distance:    Right Eye Near:   Left Eye Near:    Bilateral Near:     Physical Exam Vitals and nursing note reviewed.  Constitutional:      General: He is not in acute distress.    Appearance: Normal appearance. He is normal weight. He is not ill-appearing or toxic-appearing.  HENT:     Head: Normocephalic.     Right Ear: Tympanic membrane, ear canal and external ear normal.  There is no impacted cerumen.     Left Ear: Ear canal and external ear normal. There is no impacted cerumen.     Nose: Nose normal. No congestion.     Mouth/Throat:     Mouth: Mucous membranes are moist.     Pharynx: No oropharyngeal exudate or posterior oropharyngeal erythema.  Cardiovascular:     Rate and Rhythm: Normal rate and regular rhythm.     Pulses: Normal pulses.     Heart sounds: Normal heart sounds. No murmur.  Pulmonary:     Effort: Pulmonary effort is normal. No respiratory distress.     Breath sounds: No wheezing.  Chest:     Chest wall: No tenderness.  Abdominal:     General: Abdomen is flat. Bowel sounds are normal. There is no distension.     Palpations: There is no mass.  Skin:    Capillary Refill: Capillary refill takes less than 2 seconds.  Neurological:     Mental Status: He is alert and oriented to person, place, and time.      UC Treatments / Results  Labs (all labs ordered are listed, but only abnormal results are displayed) Labs Reviewed  POC SARS CORONAVIRUS  2 AG -  ED - Abnormal; Notable for the following components:      Result Value   SARS Coronavirus 2 Ag Positive (*)    All other components within normal limits  NOVEL CORONAVIRUS, NAA    EKG   Radiology No results found.  Procedures Procedures (including critical care time)  Medications Ordered in UC Medications - No data to display  Initial Impression / Assessment and Plan / UC Course  I have reviewed the triage vital signs and the nursing notes.  Pertinent labs & imaging results that were available during my care of the patient were reviewed by me and considered in my medical decision making (see chart for details).    Patient stable for discharge.  Benign physical exam.  Point-of-care COVID-19 test was positive.  Patient was advised to quarantine.  and to use OTC ibuprofen or Tylenol to manage pain and fever.  To return for worsening of symptoms or go to ED.   Final Clinical Impressions(s) / UC Diagnoses   Final diagnoses:  COVID-19 virus infection     Discharge Instructions     Your Rapid COVID-19 test was positive You should remain isolated in your home for 10 days from symptom onset  Get plenty of rest and push fluids Use medications daily for symptom relief Use OTC medications like ibuprofen or tylenol as needed fever or pain Call or go to the ED if you have any new or worsening symptoms such as fever, worsening cough, shortness of breath, chest tightness, chest pain, turning blue, changes in mental status, etc...     ED Prescriptions    None     PDMP not reviewed this encounter.   Durward Parcel, FNP 05/02/19 1843

## 2019-05-02 NOTE — Discharge Instructions (Addendum)
Your Rapid COVID-19 test was positive You should remain isolated in your home for 10 days from symptom onset  Get plenty of rest and push fluids Use medications daily for symptom relief Use OTC medications like ibuprofen or tylenol as needed fever or pain Call or go to the ED if you have any new or worsening symptoms such as fever, worsening cough, shortness of breath, chest tightness, chest pain, turning blue, changes in mental status, etc..[name]

## 2019-05-06 ENCOUNTER — Emergency Department (HOSPITAL_COMMUNITY): Payer: Self-pay

## 2019-05-06 ENCOUNTER — Other Ambulatory Visit: Payer: Self-pay

## 2019-05-06 ENCOUNTER — Emergency Department (HOSPITAL_COMMUNITY)
Admission: EM | Admit: 2019-05-06 | Discharge: 2019-05-07 | Disposition: A | Discharge status: Home / Self Care | Payer: Self-pay | Attending: Emergency Medicine | Admitting: Emergency Medicine

## 2019-05-06 ENCOUNTER — Encounter (HOSPITAL_COMMUNITY): Payer: Self-pay | Admitting: Emergency Medicine

## 2019-05-06 DIAGNOSIS — R519 Headache, unspecified: Secondary | ICD-10-CM | POA: Insufficient documentation

## 2019-05-06 DIAGNOSIS — R509 Fever, unspecified: Secondary | ICD-10-CM | POA: Insufficient documentation

## 2019-05-06 DIAGNOSIS — R197 Diarrhea, unspecified: Secondary | ICD-10-CM | POA: Insufficient documentation

## 2019-05-06 DIAGNOSIS — U071 COVID-19: Secondary | ICD-10-CM | POA: Insufficient documentation

## 2019-05-06 HISTORY — DX: COVID-19: U07.1

## 2019-05-06 LAB — URINALYSIS, ROUTINE W REFLEX MICROSCOPIC
Bilirubin Urine: NEGATIVE
Glucose, UA: NEGATIVE mg/dL
Ketones, ur: NEGATIVE mg/dL
Leukocytes,Ua: NEGATIVE
Nitrite: NEGATIVE
Protein, ur: 100 mg/dL — AB
Specific Gravity, Urine: 1.024 (ref 1.005–1.030)
pH: 6 (ref 5.0–8.0)

## 2019-05-06 LAB — CBC WITH DIFFERENTIAL/PLATELET
Abs Immature Granulocytes: 0.1 10*3/uL — ABNORMAL HIGH (ref 0.00–0.07)
Basophils Absolute: 0 10*3/uL (ref 0.0–0.1)
Basophils Relative: 0 %
Eosinophils Absolute: 0 10*3/uL (ref 0.0–0.5)
Eosinophils Relative: 0 %
HCT: 37.2 % — ABNORMAL LOW (ref 39.0–52.0)
Hemoglobin: 12.6 g/dL — ABNORMAL LOW (ref 13.0–17.0)
Lymphocytes Relative: 12 %
Lymphs Abs: 1.3 10*3/uL (ref 0.7–4.0)
MCH: 30 pg (ref 26.0–34.0)
MCHC: 33.9 g/dL (ref 30.0–36.0)
MCV: 88.6 fL (ref 80.0–100.0)
Metamyelocytes Relative: 1 %
Monocytes Absolute: 0.6 10*3/uL (ref 0.1–1.0)
Monocytes Relative: 5 %
Neutro Abs: 9.1 10*3/uL — ABNORMAL HIGH (ref 1.7–7.7)
Neutrophils Relative %: 82 %
Platelets: 237 10*3/uL (ref 150–400)
RBC: 4.2 MIL/uL — ABNORMAL LOW (ref 4.22–5.81)
RDW: 11.4 % — ABNORMAL LOW (ref 11.5–15.5)
WBC: 11.1 10*3/uL — ABNORMAL HIGH (ref 4.0–10.5)
nRBC: 0 % (ref 0.0–0.2)
nRBC: 0 /100 WBC

## 2019-05-06 LAB — BASIC METABOLIC PANEL
Anion gap: 12 (ref 5–15)
BUN: 10 mg/dL (ref 6–20)
CO2: 23 mmol/L (ref 22–32)
Calcium: 8.6 mg/dL — ABNORMAL LOW (ref 8.9–10.3)
Chloride: 95 mmol/L — ABNORMAL LOW (ref 98–111)
Creatinine, Ser: 1.14 mg/dL (ref 0.61–1.24)
GFR calc Af Amer: >60 mL/min (ref >60)
GFR calc non Af Amer: >60 mL/min (ref >60)
Glucose, Bld: 153 mg/dL — ABNORMAL HIGH (ref 70–99)
Potassium: 3.3 mmol/L — ABNORMAL LOW (ref 3.5–5.1)
Sodium: 130 mmol/L — ABNORMAL LOW (ref 135–145)

## 2019-05-06 MED ORDER — IBUPROFEN 800 MG PO TABS
800.0000 mg | ORAL_TABLET | Freq: Once | ORAL | Status: AC
  Administered 2019-05-06: 800 mg via ORAL
  Filled 2019-05-06: qty 1

## 2019-05-06 MED ORDER — ACETAMINOPHEN 500 MG PO TABS: 500.0000 mg | ORAL_TABLET | Freq: Four times a day (QID) | ORAL | 0 refills | Status: DC | PRN

## 2019-05-06 MED ORDER — ACETAMINOPHEN 325 MG PO TABS
650.0000 mg | ORAL_TABLET | Freq: Once | ORAL | Status: AC
  Administered 2019-05-06: 650 mg via ORAL
  Filled 2019-05-06: qty 2

## 2019-05-06 MED ORDER — SODIUM CHLORIDE 0.9 % IV BOLUS
2000.0000 mL | Freq: Once | INTRAVENOUS | Status: AC
  Administered 2019-05-06: 2000 mL via INTRAVENOUS

## 2019-05-06 MED ORDER — ONDANSETRON HCL 4 MG/2ML IJ SOLN
4.0000 mg | Freq: Once | INTRAMUSCULAR | Status: AC
  Administered 2019-05-06: 4 mg via INTRAVENOUS
  Filled 2019-05-06: qty 2

## 2019-05-06 MED ORDER — BENZONATATE 100 MG PO CAPS: 100.0000 mg | ORAL_CAPSULE | Freq: Three times a day (TID) | ORAL | 0 refills | Status: DC

## 2019-05-06 NOTE — ED Notes (Signed)
CALL MOM FOR UPDATES Walter Tapia [phone]

## 2019-05-06 NOTE — ED Triage Notes (Signed)
Patient tested positive for Covid 05/02/19 , reports persistent fever with chills, diarrhea , body aches/fatigue and cough for several days .

## 2019-05-06 NOTE — ED Provider Notes (Signed)
[facility] EMERGENCY DEPARTMENT Provider Note   CSN: [id number] Arrival date & time: 05/06/19  1949     History Chief Complaint  Patient presents with  . Covid+ Diarrhea , Chills, Fever , Cough    Walter Tapia is a 23 y.o. male.  The history is provided by the patient and medical records. No language interpreter was used.       23 year old male recently tested positive COVID-19 presenting with cold symptoms.  Patient report for the past week he has a mild frontal headache.  4 days ago he was notified that one of his coworkers test positive for COVID-19.  Patient went to urgent care to get tested.  At that time he report not having any significant symptoms.  Patient tested positive and for the past 3 to 4 days he endorsed fever, chills, general body aches, congestion, sneezing, coughing, mild shortness of breath, as well as having multiple episodes of loose stool without any blood or mucus.  He feels dehydrated.  He has been taking over-the-counter medication such as vitamin C, drinking tea without adequate relief.  He denies tobacco abuse or history of hypertension.  He does not complain of shortness of breath on exertion.  Past Medical History:  Diagnosis Date  . Allergy   . COVID-19     There are no problems to display for this patient.   History reviewed. No pertinent surgical history.     Family History  Problem Relation Age of Onset  . Diabetes Paternal Grandmother     Social History   Tobacco Use  . Smoking status: Never Smoker  . Smokeless tobacco: Never Used  Substance Use Topics  . Alcohol use: No  . Drug use: No    Home Medications Prior to Admission medications   Medication Sig Start Date End Date Taking? Authorizing Provider  hydrocortisone 2.5 % cream Apply topically 2 (two) times daily. Patient not taking: Reported on 11/21/2018 09/30/18   Wurst, [place], PA-C  levocetirizine (XYZAL) 5 MG tablet Take 5 mg by mouth every  evening.    [provider]  meloxicam (MOBIC) 7.5 MG tablet Take 1 tablet (7.5 mg total) by mouth daily. Patient not taking: Reported on 11/21/2018 09/30/18   Alvino Chapel [place], PA-C    Allergies    Patient has no known allergies.  Review of Systems   Review of Systems  All other systems reviewed and are negative.   Physical Exam Updated Vital Signs BP 127/74 (BP Location: Right Arm)   Pulse (!) 118   Temp (!) 103 F (39.4 C) (Oral)   Resp 20   SpO2 97%   Physical Exam Vitals and nursing note reviewed.  Constitutional:      General: He is not in acute distress.    Appearance: He is well-developed.     Comments: Patient is well-appearing in no acute discomfort.  Occasional cough but no shortness of breath.  HENT:     Head: Atraumatic.  Eyes:     Conjunctiva/sclera: Conjunctivae normal.  Cardiovascular:     Rate and Rhythm: Tachycardia present.     Pulses: Normal pulses.     Heart sounds: Normal heart sounds.  Pulmonary:     Effort: Pulmonary effort is normal.     Breath sounds: Normal breath sounds. No wheezing, rhonchi or rales.  Abdominal:     Palpations: Abdomen is soft.     Tenderness: There is no abdominal tenderness.  Musculoskeletal:  General: Normal range of motion.     Cervical back: Neck supple.  Skin:    General: Skin is warm.     Findings: No rash.  Neurological:     Mental Status: He is alert and oriented to person, place, and time.  Psychiatric:        Mood and Affect: Mood normal.     ED Results / Procedures / Treatments   Labs (all labs ordered are listed, but only abnormal results are displayed) Labs Reviewed  URINALYSIS, ROUTINE W REFLEX MICROSCOPIC - Abnormal; Notable for the following components:      Result Value   APPearance HAZY (*)    Hgb urine dipstick SMALL (*)    Protein, ur 100 (*)    Bacteria, UA FEW (*)    All other components within normal limits  CBC WITH DIFFERENTIAL/PLATELET - Abnormal; Notable for the  following components:   WBC 11.1 (*)    RBC 4.20 (*)    Hemoglobin 12.6 (*)    HCT 37.2 (*)    RDW 11.4 (*)    Neutro Abs 9.1 (*)    Abs Immature Granulocytes 0.10 (*)    All other components within normal limits  BASIC METABOLIC PANEL - Abnormal; Notable for the following components:   Sodium 130 (*)    Potassium 3.3 (*)    Chloride 95 (*)    Glucose, Bld 153 (*)    Calcium 8.6 (*)    All other components within normal limits    EKG None  Radiology DG Chest Portable 1 View  Result Date: 05/06/2019 CLINICAL DATA:  Cough, fever, tested COVID-19 positive on 05/02/2019 EXAM: PORTABLE CHEST 1 VIEW COMPARISON:  Portable exam 2131 hours compared to 11/21/2018 FINDINGS: Upper normal heart size. Mediastinal contours and pulmonary vascularity normal. Lungs clear. No pleural effusion or pneumothorax. Bones unremarkable. IMPRESSION: No acute abnormalities. Electronically Signed   By: Lavonia Dana M.D.   On: 05/06/2019 21:51    Procedures Procedures (including critical care time)  Medications Ordered in ED Medications  acetaminophen (TYLENOL) tablet 650 mg (650 mg Oral Given 05/06/19 2007)  ibuprofen (ADVIL) tablet 800 mg (800 mg Oral Given 05/06/19 2235)  sodium chloride 0.9 % bolus 2,000 mL (2,000 mLs Intravenous New Bag/Given 05/06/19 2241)  ondansetron (ZOFRAN) injection 4 mg (4 mg Intravenous Given 05/06/19 2240)    ED Course  I have reviewed the triage vital signs and the nursing notes.  Pertinent labs & imaging results that were available during my care of the patient were reviewed by me and considered in my medical decision making (see chart for details).    MDM Rules/Calculators/A&P                      BP 131/86   Pulse 99   Temp (!) 102.9 F (39.4 C) (Oral)   Resp (!) 22   SpO2 100%   Final Clinical Impression(s) / ED Diagnoses Final diagnoses:  COVID-19 virus infection    Rx / DC Orders ED Discharge Orders         Ordered    benzonatate (TESSALON) 100 MG  capsule  Every 8 hours     05/06/19 2348    acetaminophen (TYLENOL) 500 MG tablet  Every 6 hours PRN     05/06/19 2348         9:53 PM Patient recently tested positive for COVID-19 here with COVID-19 symptoms.  Patient was found to be febrile with a temperature  of 103 and tachycardic with heart rate of 118.  Blood pressure is normal.  Patient otherwise well-appearing.  Labs are reassuring.  Will give fever reducer as well as IV fluid and antinausea medication.  11:49 PM Labs are reassuring, chest x-ray without concerning feature, patient received IV fluid here, felt bit better.  Patient discharged home with symptomatic treatment and return precaution given.  Walter Tapia was evaluated in Emergency Department on 05/06/2019 for the symptoms described in the history of present illness. He was evaluated in the context of the global COVID-19 pandemic, which necessitated consideration that the patient might be at risk for infection with the SARS-CoV-2 virus that causes COVID-19. Institutional protocols and algorithms that pertain to the evaluation of patients at risk for COVID-19 are in a state of rapid change based on information released by regulatory bodies including the CDC and federal and state organizations. These policies and algorithms were followed during the patient's care in the ED.    Fayrene Helperran, Yitzhak Awan, PA-C 05/06/19 2350    Arby BarrettePfeiffer, Marcy, MD 05/07/19 [phone]

## 2019-05-06 NOTE — Discharge Instructions (Signed)
You have been diagnosed with COVID-19 infection.  Stay hydrated, take tylenol as needed for fever.  Take tessalon for cough.  Return if you developed significant shortness of breath or if you have any other concerns.

## 2019-05-06 NOTE — ED Notes (Signed)
Portable xray at bedside.

## 2019-05-14 ENCOUNTER — Other Ambulatory Visit: Payer: Self-pay

## 2019-05-14 ENCOUNTER — Ambulatory Visit (INDEPENDENT_AMBULATORY_CARE_PROVIDER_SITE_OTHER): Payer: HRSA Program

## 2019-05-14 ENCOUNTER — Ambulatory Visit (HOSPITAL_COMMUNITY)
Admission: EM | Admit: 2019-05-14 | Discharge: 2019-05-14 | Disposition: A | Discharge status: Home / Self Care | Payer: HRSA Program | Attending: Family Medicine | Admitting: Family Medicine

## 2019-05-14 ENCOUNTER — Encounter (HOSPITAL_COMMUNITY): Payer: Self-pay | Admitting: Emergency Medicine

## 2019-05-14 DIAGNOSIS — R0789 Other chest pain: Secondary | ICD-10-CM | POA: Diagnosis not present

## 2019-05-14 DIAGNOSIS — J181 Lobar pneumonia, unspecified organism: Secondary | ICD-10-CM

## 2019-05-14 DIAGNOSIS — U071 COVID-19: Secondary | ICD-10-CM

## 2019-05-14 DIAGNOSIS — J189 Pneumonia, unspecified organism: Secondary | ICD-10-CM

## 2019-05-14 MED ORDER — AMOXICILLIN 500 MG PO CAPS: 1000.0000 mg | ORAL_CAPSULE | Freq: Three times a day (TID) | ORAL | 0 refills | Status: DC

## 2019-05-14 MED ORDER — AZITHROMYCIN 250 MG PO TABS: 250.0000 mg | ORAL_TABLET | Freq: Every day | ORAL | 0 refills | Status: DC

## 2019-05-14 NOTE — ED Triage Notes (Signed)
PT diagnosed with COVID 12/18. ER visit 12/22. Here today for right side pain that is worse with deep breaths and coughs.

## 2019-05-17 NOTE — ED Provider Notes (Signed)
[place]   [id number] 05/14/19 Arrival Time: 1842  ASSESSMENT & PLAN:  1. COVID-19 virus infection   2. Pneumonia of both lower lobes due to infectious organism     Discussed this is possibly COVID-related. Will cover with below regardless.  Begin: Meds ordered this encounter  Medications  . amoxicillin (AMOXIL) 500 MG capsule    Sig: Take 2 capsules (1,000 mg total) by mouth 3 (three) times daily.    Dispense:  60 capsule    Refill:  0  . azithromycin (ZITHROMAX) 250 MG tablet    Sig: Take 1 tablet (250 mg total) by mouth daily. Take first 2 tablets together, then 1 every day until finished.    Dispense:  6 tablet    Refill:  0    Hospital return precautions discussed. Encouraged rest and adequate fluid intake. No indication for hospital admission at this time.   Reviewed expectations re: course of current medical issues. Questions answered. Outlined signs and symptoms indicating need for more acute intervention. Patient verbalized understanding. After Visit Summary given.   SUBJECTIVE:  side pain   History from: patient. Walter Tapia is a 24 y.o. male who presents with complaint of intermittent "pain on my right side" described as dull that does not radiate. Onset gradual, first noted a few d ago. Reports described symptoms are unchanged since beginning. Injury or recent strenuous activity: none. Dx with COVID on 12/18. Has been coughing. When present, rates as 3/10 in intensity when present; without associated n/v; without associated SOB. Typical duration of symptoms when present: varies widely. Denies: chest pain, irregular heart beat, lower extremity edema, near-syncope, orthopnea, palpitations, paroxysmal nocturnal dyspnea and syncope. Aggravating factors: include deep breaths and when he coughs. Alleviating factors: have not been identified. Does not think he has run a fever. Ambulatory without assistance. Self/OTC treatment: none. Illicit  drug use: none.  Social History   Tobacco Use  Smoking Status Never Smoker  Smokeless Tobacco Never Used   Social History   Substance and Sexual Activity  Alcohol Use No     ROS: As per HPI. All other systems negative.   OBJECTIVE:  Vitals:   05/14/19 1920  BP: 132/67  Pulse: 78  Resp: (!) 24  Temp: 99.4 F (37.4 C)  TempSrc: Oral  SpO2: 100%    Recheck RR: 18  General appearance: alert, oriented, no acute distress; appears fatigued Eyes: PERRLA; EOMI; conjunctivae normal HENT: normocephalic; atraumatic Neck: supple with FROM Lungs: without labored respirations; slightly decreased lung sounds over bases; otherwise CTAB Heart: regular rate and rhythm without murmer Chest Wall: without tenderness to palpation Abdomen: soft, non-tender; bowel sounds normal; no masses or organomegaly; no guarding or rebound tenderness Extremities: without edema; without calf swelling or tenderness; symmetrical without gross deformities Skin: warm and dry; without rash or lesions Psychological: alert and cooperative; normal mood and affect   Imaging: DG Chest 2 View  Result Date: 05/14/2019 CLINICAL DATA:  Chest pain EXAM: CHEST - 2 VIEW COMPARISON:  05/06/2019 FINDINGS: Patchy airspace opacities at the bases. Low lung volumes. No significant pleural effusion. Normal heart size. No pneumothorax. IMPRESSION: Patchy lung base airspace opacities, probable pneumonia. Electronically Signed   By: Donavan Foil M.D.   On: 05/14/2019 19:46   DG Chest Portable 1 View  Result Date: 05/06/2019 CLINICAL DATA:  Cough, fever, tested COVID-19 positive on 05/02/2019 EXAM: PORTABLE CHEST 1 VIEW COMPARISON:  Portable exam 2131 hours compared to 11/21/2018 FINDINGS: Upper normal heart size. Mediastinal contours  and pulmonary vascularity normal. Lungs clear. No pleural effusion or pneumothorax. Bones unremarkable. IMPRESSION: No acute abnormalities. Electronically Signed   By: Ulyses Southward M.D.   On:  05/06/2019 21:51      No Known Allergies  Past Medical History:  Diagnosis Date  . Allergy   . COVID-19    Social History   Socioeconomic History  . Marital status: Single    Spouse name: Not on file  . Number of children: Not on file  . Years of education: Not on file  . Highest education level: Not on file  Occupational History  . Not on file  Tobacco Use  . Smoking status: Never Smoker  . Smokeless tobacco: Never Used  Substance and Sexual Activity  . Alcohol use: No  . Drug use: No  . Sexual activity: Yes    Partners: Female    Birth control/protection: Condom  Other Topics Concern  . Not on file  Social History Narrative  . Not on file   Social Determinants of Health   Financial Resource Strain:   . Difficulty of Paying Living Expenses: Not on file  Food Insecurity:   . Worried About Programme researcher, broadcasting/film/video in the Last Year: Not on file  . Ran Out of Food in the Last Year: Not on file  Transportation Needs:   . Lack of Transportation (Medical): Not on file  . Lack of Transportation (Non-Medical): Not on file  Physical Activity:   . Days of Exercise per Week: Not on file  . Minutes of Exercise per Session: Not on file  Stress:   . Feeling of Stress : Not on file  Social Connections:   . Frequency of Communication with Friends and Family: Not on file  . Frequency of Social Gatherings with Friends and Family: Not on file  . Attends Religious Services: Not on file  . Active Member of Clubs or Organizations: Not on file  . Attends Banker Meetings: Not on file  . Marital Status: Not on file  Intimate Partner Violence:   . Fear of Current or Ex-Partner: Not on file  . Emotionally Abused: Not on file  . Physically Abused: Not on file  . Sexually Abused: Not on file   Family History  Problem Relation Age of Onset  . Diabetes Paternal Grandmother    History reviewed. No pertinent surgical history.   Mardella Layman, MD 05/17/19 [phone]

## 2019-05-26 ENCOUNTER — Encounter (HOSPITAL_COMMUNITY): Payer: Self-pay | Admitting: Emergency Medicine

## 2019-05-26 ENCOUNTER — Other Ambulatory Visit: Payer: Self-pay

## 2019-05-26 ENCOUNTER — Ambulatory Visit (HOSPITAL_COMMUNITY)
Admission: EM | Admit: 2019-05-26 | Discharge: 2019-05-26 | Disposition: A | Discharge status: Home / Self Care | Payer: Self-pay | Attending: Internal Medicine | Admitting: Internal Medicine

## 2019-05-26 DIAGNOSIS — R091 Pleurisy: Secondary | ICD-10-CM

## 2019-05-26 DIAGNOSIS — R0789 Other chest pain: Secondary | ICD-10-CM

## 2019-05-26 MED ORDER — IBUPROFEN 600 MG PO TABS: 600.0000 mg | ORAL_TABLET | Freq: Four times a day (QID) | ORAL | 0 refills | Status: DC | PRN

## 2019-05-26 NOTE — ED Provider Notes (Signed)
[place]    CSN: [id number] Arrival date & time: 05/26/19  1443      History   Chief Complaint Chief Complaint  Patient presents with  . Chest Pain    RL    HPI Walter Tapia is a 24 y.o. male diagnosed with COVID-19 on 05/02/2019 and subsequently developed covid pneumonia comes to the urgent care with right-sided sharp chest pain.  Patient said he had similar chest pain during the Covid infection.  Chest pain improved while she was on antibiotics and some pain medications.  Chest pain has been somewhat persistent over the past several days.  He denies any cough or sputum production.  No shortness of breath.  No fever or chills.  No dizziness.      [name]   HPI  Past Medical History:  Diagnosis Date  . Allergy   . COVID-19     There are no problems to display for this patient.   History reviewed. No pertinent surgical history.     Home Medications    Prior to Admission medications   Medication Sig Start Date End Date Taking? Authorizing Provider  acetaminophen (TYLENOL) 500 MG tablet Take 1 tablet (500 mg total) by mouth every 6 (six) hours as needed. 05/06/19  Yes Domenic Moras, PA-C  benzonatate (TESSALON) 100 MG capsule Take 1 capsule (100 mg total) by mouth every 8 (eight) hours. 05/06/19  Yes Domenic Moras, PA-C  ibuprofen (ADVIL) 600 MG tablet Take 1 tablet (600 mg total) by mouth every 6 (six) hours as needed. 05/26/19   Deloyce Walthers, Myrene Galas, MD  levocetirizine (XYZAL) 5 MG tablet Take 5 mg by mouth every evening.    [provider]    Family History Family History  Problem Relation Age of Onset  . Diabetes Paternal Grandmother     Social History Social History   Tobacco Use  . Smoking status: Never Smoker  . Smokeless tobacco: Never Used  Substance Use Topics  . Alcohol use: No  . Drug use: No     Allergies   Patient has no known allergies.   Review of Systems Review of Systems  Constitutional: Negative for activity  change, chills, fatigue and fever.  HENT: Negative.   Respiratory: Positive for cough and shortness of breath. Negative for chest tightness and wheezing.   Gastrointestinal: Negative for abdominal pain, nausea and vomiting.  Genitourinary: Negative.   Neurological: Negative.  Negative for dizziness, weakness, light-headedness, numbness and headaches.     Physical Exam Triage Vital Signs ED Triage Vitals  Enc Vitals Group     BP 05/26/19 1550 (!) 110/96     Pulse Rate 05/26/19 1550 92     Resp 05/26/19 1550 (!) 24     Temp 05/26/19 1550 98.3 F (36.8 C)     Temp Source 05/26/19 1550 Oral     SpO2 05/26/19 1550 100 %     Weight --      Height --      Head Circumference --      Peak Flow --      Pain Score 05/26/19 1551 9     Pain Loc --      Pain Edu? --      Excl. in [place]? --    No data found.  Updated Vital Signs BP (!) 110/96 (BP Location: Left Arm) Comment: Pt kept bendin arm during BP  Pulse 92   Temp 98.3 F (36.8 C) (Oral)   Resp (!) 24  SpO2 100%   Visual Acuity Right Eye Distance:   Left Eye Distance:   Bilateral Distance:    Right Eye Near:   Left Eye Near:    Bilateral Near:     Physical Exam Constitutional:      Appearance: He is not ill-appearing or diaphoretic.  Pulmonary:     Effort: Pulmonary effort is normal.     Breath sounds: Normal breath sounds. No decreased breath sounds, wheezing, rhonchi or rales.  Abdominal:     General: Bowel sounds are normal.  Musculoskeletal:        General: Normal range of motion.     Cervical back: Normal range of motion and neck supple.  Skin:    General: Skin is warm.     Capillary Refill: Capillary refill takes less than 2 seconds.  Neurological:     Mental Status: He is alert.      UC Treatments / Results  Labs (all labs ordered are listed, but only abnormal results are displayed) Labs Reviewed - No data to display  EKG   Radiology No results found.  Procedures Procedures (including  critical care time)  Medications Ordered in UC Medications - No data to display  Initial Impression / Assessment and Plan / UC Course  I have reviewed the triage vital signs and the nursing notes.  Pertinent labs & imaging results that were available during my care of the patient were reviewed by me and considered in my medical decision making (see chart for details).     1.  Pleurisy: Patient was counseled about his symptoms.  This may be related to Covid pneumonia.  I assured patient that his symptoms will improve.  He is advised to use NSAIDs for pain and continue to exercise to improve his tolerance. Final Clinical Impressions(s) / UC Diagnoses   Final diagnoses:  Right-sided chest wall pain  Pleurisy   Discharge Instructions   None    ED Prescriptions    Medication Sig Dispense Auth. Provider   ibuprofen (ADVIL) 600 MG tablet Take 1 tablet (600 mg total) by mouth every 6 (six) hours as needed. 30 tablet Gelila Well, Britta Mccreedy, MD     PDMP not reviewed this encounter.   Merrilee Jansky, MD 05/26/19 [phone]

## 2019-05-26 NOTE — ED Triage Notes (Signed)
Pt has been seen three times prior to this visit,12/18 Covid +, 12/22 ED visit and again on 12/30 for right sided lower chest discomfort.  Pt here today for continued right sided lower pain.  Pt describes moments of feeling SOB but not sure he can actually call it that.  Pt states he has completed all medications prescribed to him over the last 3 weeks or so.

## 2020-09-26 IMAGING — DX CHEST - 2 VIEW
2 series · 2 of 2 positions shown · non-contrast
Comparison: 10/30/2005

CLINICAL DATA: 23 y/o M; right-sided chest pain that began after
work tonight. Occasional cough.

EXAM:
CHEST - 2 VIEW

[chest pa]
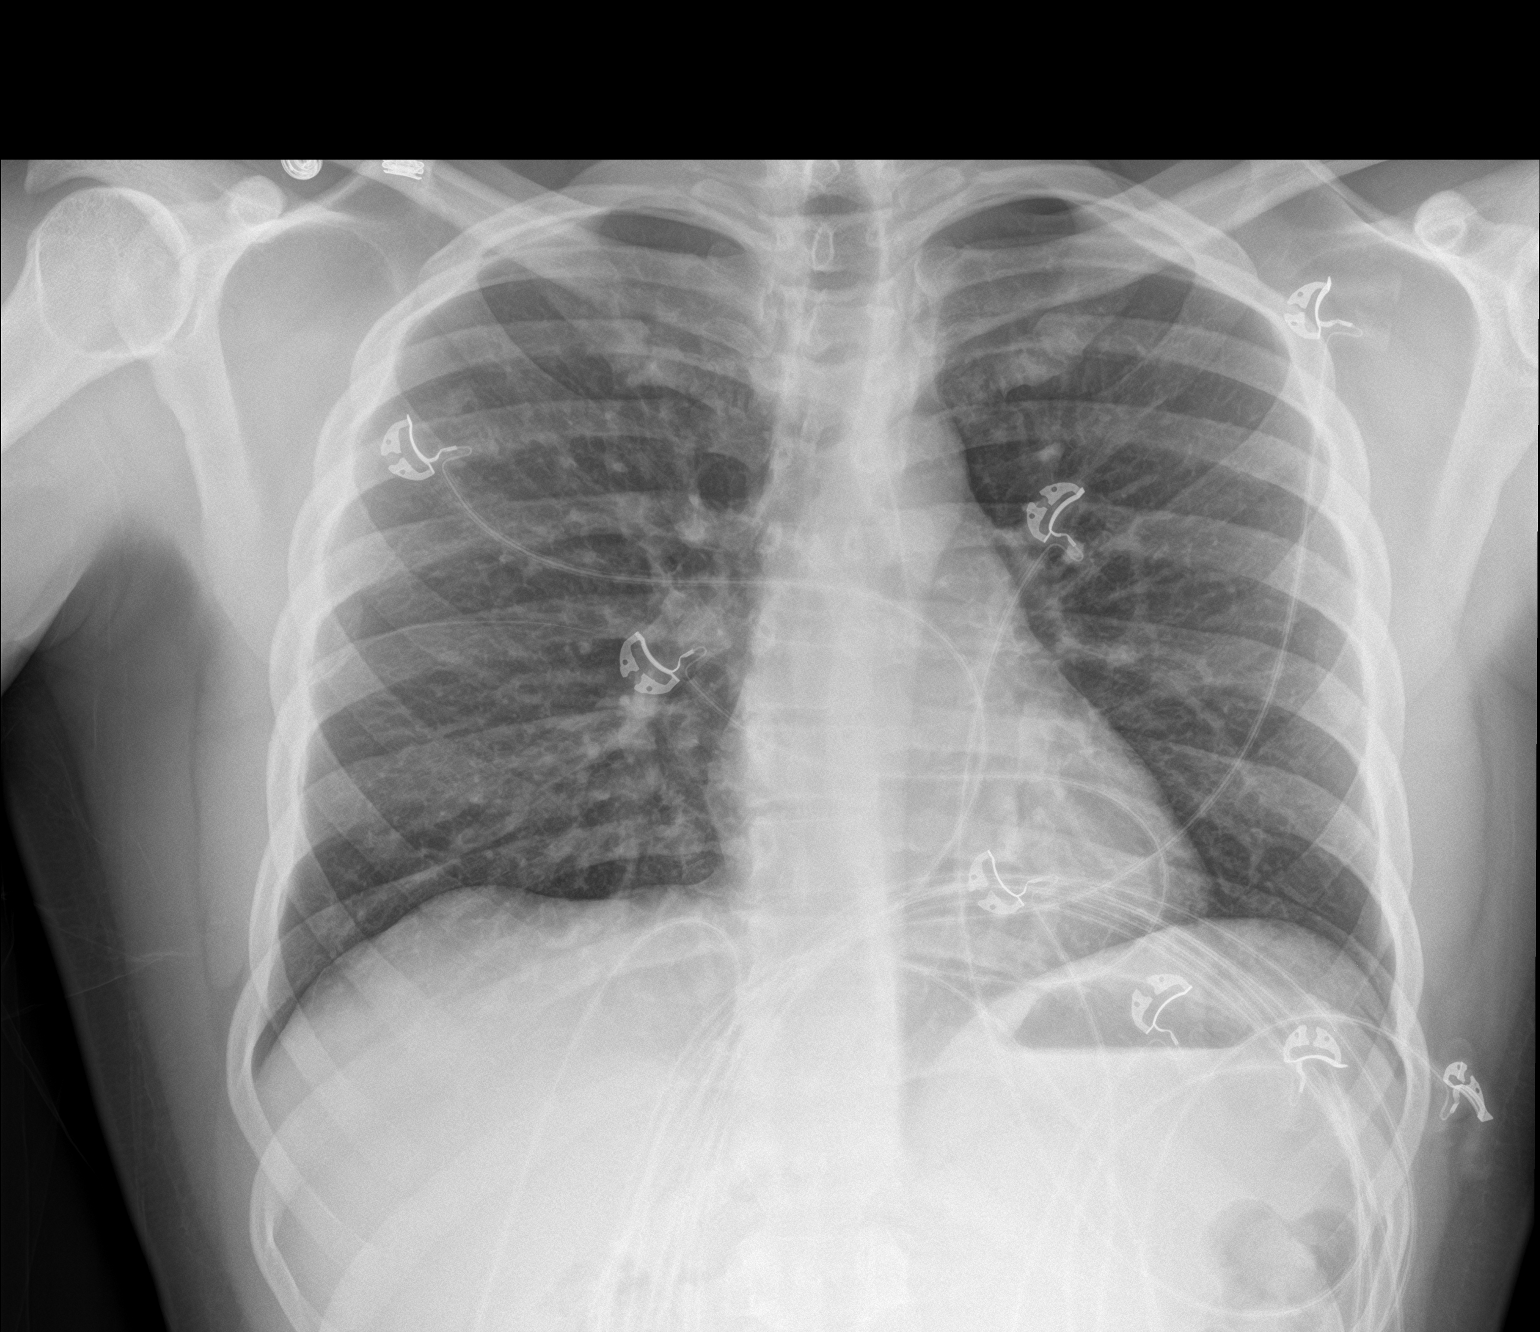

[chest lat]
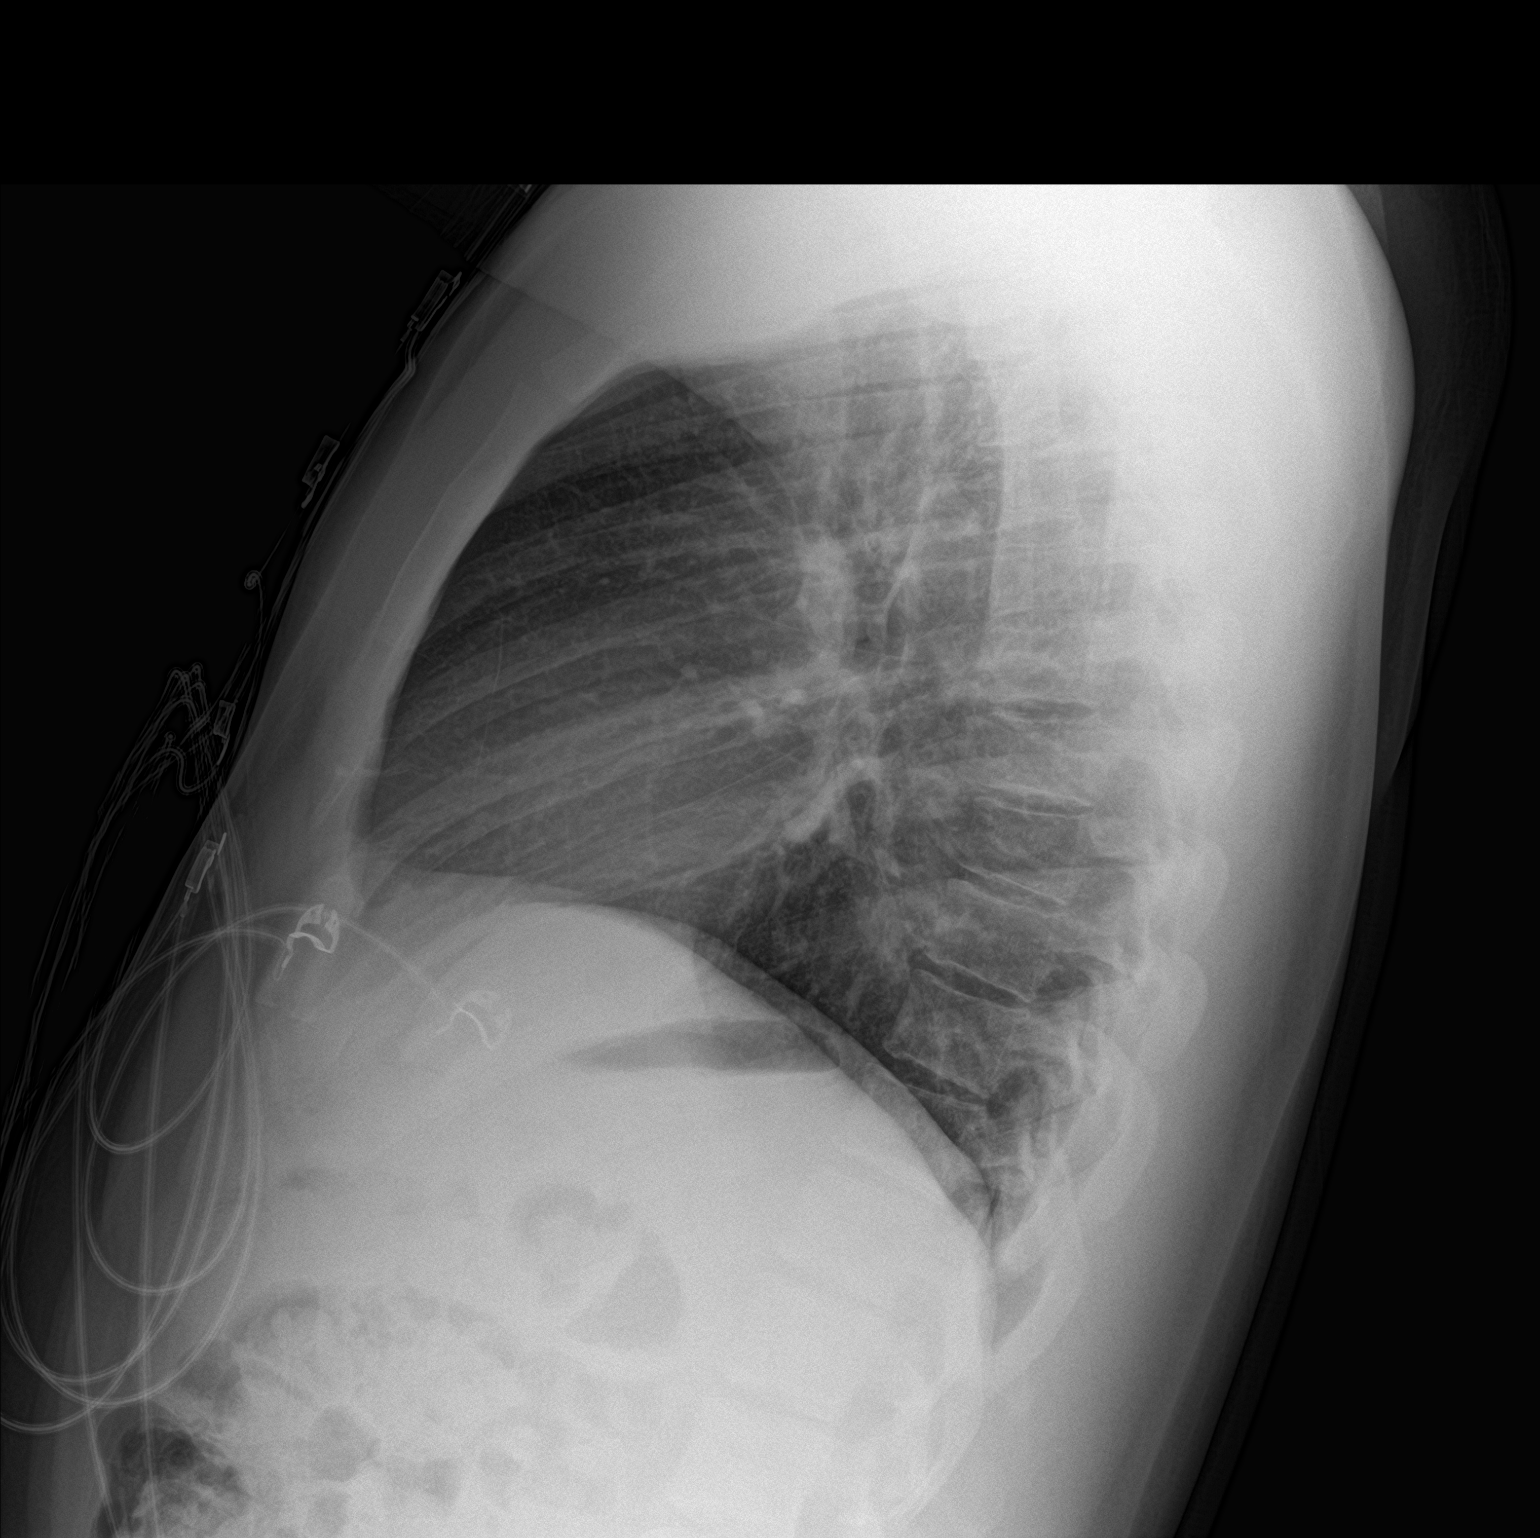

[2 of 2 positions shown; findings below may reference images not displayed]

FINDINGS: Stable heart size and mediastinal contours are within normal limits.
Both lungs are clear. The visualized skeletal structures are
unremarkable.
IMPRESSION: No active cardiopulmonary disease.

## 2021-05-15 IMAGING — DX DG CHEST 1V PORT
1 series · 1 of 1 positions shown · non-contrast
Comparison: Portable exam 2060 hours compared to 11/21/2018

CLINICAL DATA: Cough, fever, tested HFISB-CR positive on 05/02/2019

EXAM:
PORTABLE CHEST 1 VIEW

[chest ap]
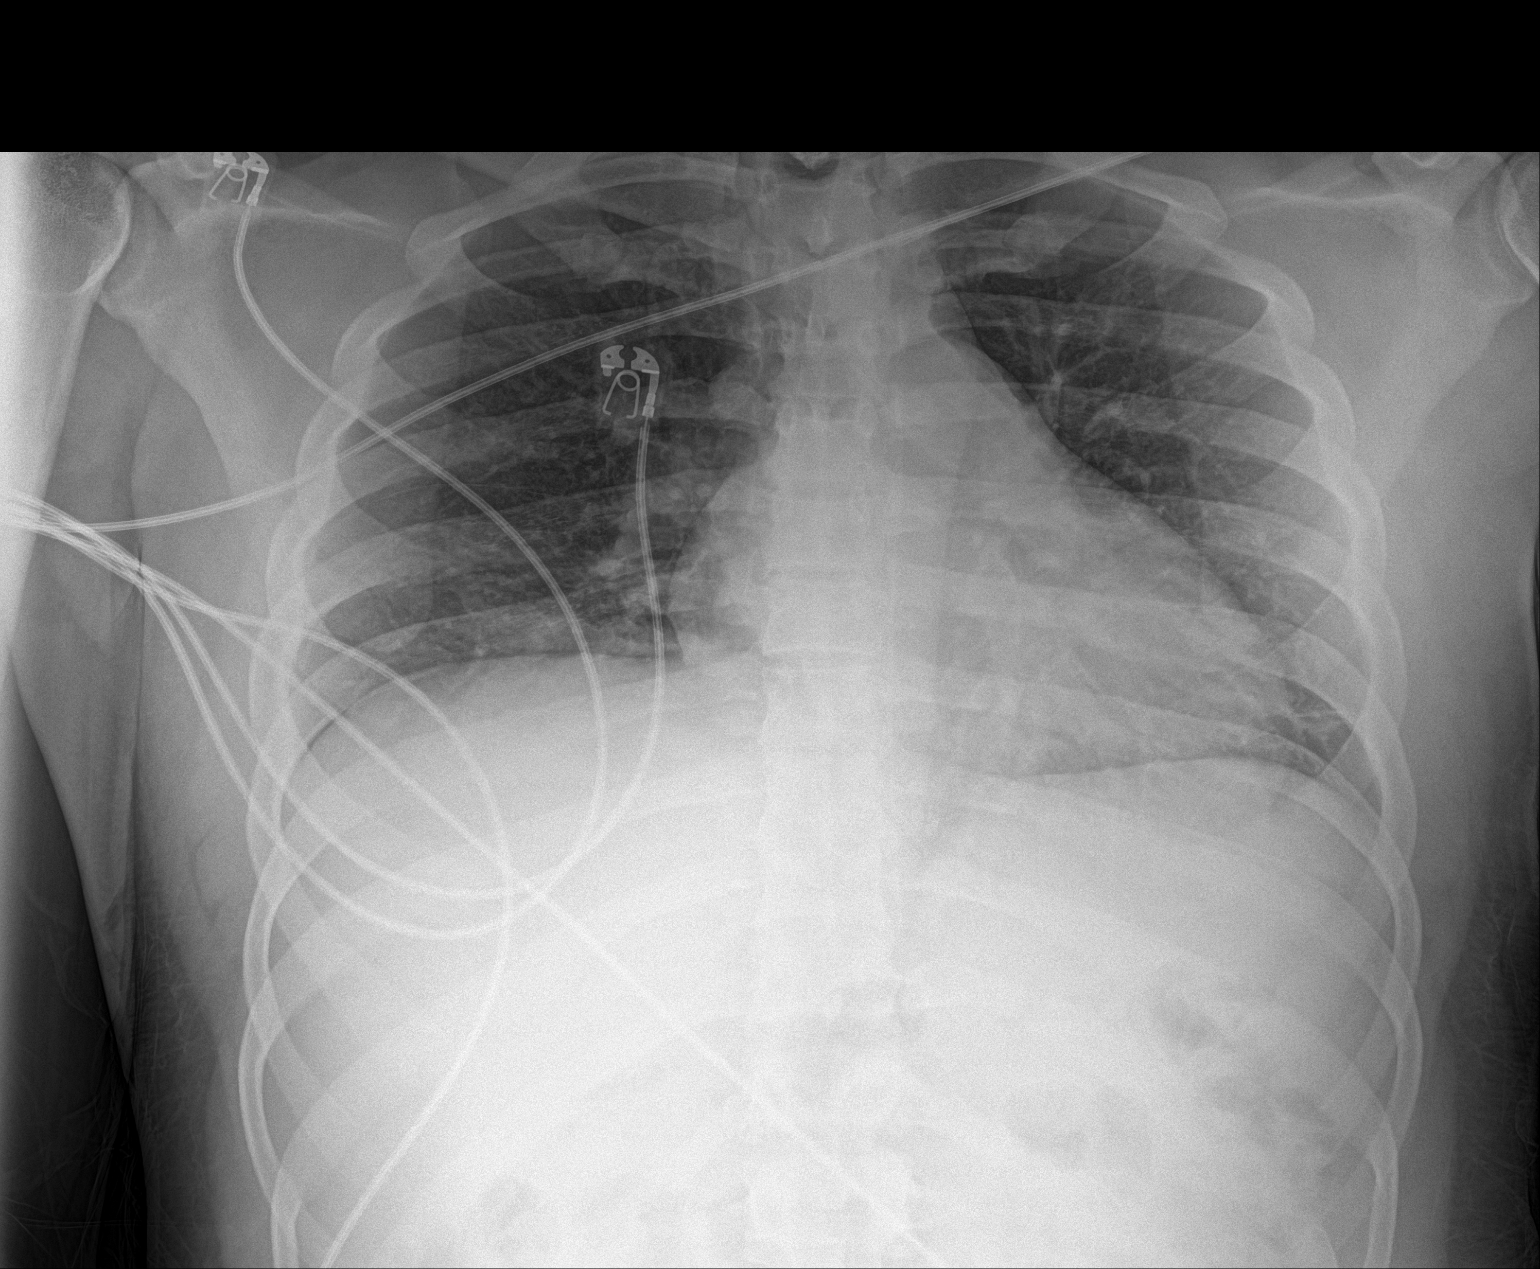

[1 of 1 positions shown; findings below may reference images not displayed]

FINDINGS: Upper normal heart size.

Mediastinal contours and pulmonary vascularity normal.

Lungs clear.

No pleural effusion or pneumothorax.

Bones unremarkable.
IMPRESSION: No acute abnormalities.

## 2021-05-27 ENCOUNTER — Encounter: Payer: Self-pay | Admitting: Emergency Medicine

## 2021-05-27 ENCOUNTER — Ambulatory Visit
Admission: EM | Admit: 2021-05-27 | Discharge: 2021-05-27 | Disposition: A | Discharge status: Home / Self Care | Payer: BC Managed Care – PPO | Attending: Family Medicine | Admitting: Family Medicine

## 2021-05-27 ENCOUNTER — Other Ambulatory Visit: Payer: Self-pay

## 2021-05-27 DIAGNOSIS — R7309 Other abnormal glucose: Secondary | ICD-10-CM | POA: Insufficient documentation

## 2021-05-27 DIAGNOSIS — Z113 Encounter for screening for infections with a predominantly sexual mode of transmission: Secondary | ICD-10-CM | POA: Diagnosis present

## 2021-05-27 NOTE — ED Triage Notes (Signed)
Wants STD testing.  States he does not have an exposure that he knows of.  Denies discharge or pain.  Does want blood sugar checked.

## 2021-05-27 NOTE — ED Provider Notes (Signed)
[facility]    CSN: [id number] Arrival date & time: 05/27/21  1729      History   Chief Complaint No chief complaint on file.   HPI Yannick HOLLAN SANANGELO is a 26 y.o. male.   Patient presenting today requesting STD screening.  He states the last time he was screened was back in July.  He is asymptomatic and has had no known new exposures.  He is also requesting that his blood sugars be checked while he is here as he has had a history of some mildly elevated blood pressure readings in the past.  He is also asymptomatic with this.   Past Medical History:  Diagnosis Date   Allergy    COVID-19     There are no problems to display for this patient.   History reviewed. No pertinent surgical history.     Home Medications    Prior to Admission medications   Medication Sig Start Date End Date Taking? Authorizing Provider  acetaminophen (TYLENOL) 500 MG tablet Take 1 tablet (500 mg total) by mouth every 6 (six) hours as needed. 05/06/19   Domenic Moras, PA-C  benzonatate (TESSALON) 100 MG capsule Take 1 capsule (100 mg total) by mouth every 8 (eight) hours. 05/06/19   Domenic Moras, PA-C  ibuprofen (ADVIL) 600 MG tablet Take 1 tablet (600 mg total) by mouth every 6 (six) hours as needed. 05/26/19   Lamptey, Myrene Galas, MD  levocetirizine (XYZAL) 5 MG tablet Take 5 mg by mouth every evening.    [provider]    Family History Family History  Problem Relation Age of Onset   Diabetes Paternal Grandmother     Social History Social History   Tobacco Use   Smoking status: Never   Smokeless tobacco: Never  Substance Use Topics   Alcohol use: No   Drug use: No     Allergies   Patient has no known allergies.   Review of Systems Review of Systems Per HPI  Physical Exam Triage Vital Signs ED Triage Vitals  Enc Vitals Group     BP 05/27/21 1840 (!) 112/59     Pulse Rate 05/27/21 1840 70     Resp 05/27/21 1840 18     Temp 05/27/21 1840 98.2 F (36.8  C)     Temp Source 05/27/21 1840 Oral     SpO2 05/27/21 1840 99 %     Weight --      Height --      Head Circumference --      Peak Flow --      Pain Score 05/27/21 1844 0     Pain Loc --      Pain Edu? --      Excl. in [place]? --    No data found.  Updated Vital Signs BP (!) 112/59 (BP Location: Right Arm)    Pulse 70    Temp 98.2 F (36.8 C) (Oral)    Resp 18    SpO2 99%   Visual Acuity Right Eye Distance:   Left Eye Distance:   Bilateral Distance:    Right Eye Near:   Left Eye Near:    Bilateral Near:     Physical Exam Vitals and nursing note reviewed.  Constitutional:      Appearance: Normal appearance.  HENT:     Head: Atraumatic.  Eyes:     Extraocular Movements: Extraocular movements intact.     Conjunctiva/sclera: Conjunctivae normal.  Cardiovascular:  Rate and Rhythm: Normal rate and regular rhythm.  Pulmonary:     Effort: Pulmonary effort is normal.     Breath sounds: Normal breath sounds.  Genitourinary:    Comments: GU exam deferred, self swab performed Musculoskeletal:        General: Normal range of motion.     Cervical back: Normal range of motion and neck supple.  Skin:    General: Skin is warm and dry.  Neurological:     General: No focal deficit present.     Mental Status: He is oriented to person, place, and time.  Psychiatric:        Mood and Affect: Mood normal.        Thought Content: Thought content normal.        Judgment: Judgment normal.     UC Treatments / Results  Labs (all labs ordered are listed, but only abnormal results are displayed) Labs Reviewed  BASIC METABOLIC PANEL  HIV ANTIBODY (ROUTINE TESTING W REFLEX)  RPR  POCT FASTING CBG [facility] MANUAL ENTRY  CYTOLOGY, (ORAL, ANAL, URETHRAL) ANCILLARY ONLY    EKG   Radiology No results found.  Procedures Procedures (including critical care time)  Medications Ordered in UC Medications - No data to display  Initial Impression / Assessment and Plan / UC Course  I  have reviewed the triage vital signs and the nursing notes.  Pertinent labs & imaging results that were available during my care of the patient were reviewed by me and considered in my medical decision making (see chart for details).     Cytology swab, HIV and syphilis labs all pending.  We will treat based on results for these.  Unable to obtain a fingerstick glucose today after multiple attempts so BMP added onto lab work.  Follow-up with PCP for further management of this.  Discussed healthy eating habits.  Final Clinical Impressions(s) / UC Diagnoses   Final diagnoses:  Routine screening for STI (sexually transmitted infection)  Abnormal blood sugar   Discharge Instructions   None    ED Prescriptions   None    PDMP not reviewed this encounter.   Volney American, [place] 05/27/21 1915

## 2021-05-29 LAB — BASIC METABOLIC PANEL
BUN/Creatinine Ratio: 14 (ref 9–20)
BUN: 13 mg/dL (ref 6–20)
CO2: 24 mmol/L (ref 20–29)
Calcium: 10 mg/dL (ref 8.7–10.2)
Chloride: 99 mmol/L (ref 96–106)
Creatinine, Ser: 0.96 mg/dL (ref 0.76–1.27)
Glucose: 89 mg/dL (ref 70–99)
Potassium: 4.2 mmol/L (ref 3.5–5.2)
Sodium: 139 mmol/L (ref 134–144)
eGFR: 113 mL/min/{1.73_m2} (ref >59)

## 2021-05-29 LAB — RPR: RPR Ser Ql: NONREACTIVE

## 2021-05-29 LAB — HIV ANTIBODY (ROUTINE TESTING W REFLEX): HIV Screen 4th Generation wRfx: NONREACTIVE

## 2021-05-30 LAB — CYTOLOGY, (ORAL, ANAL, URETHRAL) ANCILLARY ONLY
Chlamydia: NEGATIVE
Comment: NEGATIVE
Comment: NEGATIVE
Comment: NORMAL
Neisseria Gonorrhea: NEGATIVE
Trichomonas: NEGATIVE

## 2021-10-04 ENCOUNTER — Encounter: Payer: Self-pay | Admitting: Emergency Medicine

## 2021-10-04 ENCOUNTER — Ambulatory Visit
Admission: EM | Admit: 2021-10-04 | Discharge: 2021-10-04 | Disposition: A | Discharge status: Home / Self Care | Payer: BC Managed Care – PPO | Attending: Nurse Practitioner | Admitting: Nurse Practitioner

## 2021-10-04 ENCOUNTER — Other Ambulatory Visit: Payer: Self-pay

## 2021-10-04 DIAGNOSIS — Z202 Contact with and (suspected) exposure to infections with a predominantly sexual mode of transmission: Secondary | ICD-10-CM

## 2021-10-04 DIAGNOSIS — Z113 Encounter for screening for infections with a predominantly sexual mode of transmission: Secondary | ICD-10-CM | POA: Diagnosis present

## 2021-10-04 LAB — POCT URINALYSIS DIP (MANUAL ENTRY)
Bilirubin, UA: NEGATIVE
Blood, UA: NEGATIVE
Glucose, UA: NEGATIVE mg/dL
Ketones, POC UA: NEGATIVE mg/dL
Leukocytes, UA: NEGATIVE
Nitrite, UA: NEGATIVE
Protein Ur, POC: NEGATIVE mg/dL
Spec Grav, UA: 1.015 (ref 1.010–1.025)
Urobilinogen, UA: 0.2 E.U./dL
pH, UA: 7.5 (ref 5.0–8.0)

## 2021-10-04 NOTE — Discharge Instructions (Addendum)
Your urinalysis was negative today.  A urine culture has been ordered for confirmatory testing  Your cytology, HIV and syphilis results will be available within the next 24 to 48 hours.  If you have access to MyChart, you will be able to see the results there. Increase condom use.  Follow-up as needed.

## 2021-10-04 NOTE — ED Triage Notes (Signed)
Pt reports intermittent blood from tip of penis and testicles "not feeling right." Pt reports last unprotected intercourse approximately 1 month ago and states no known exposures.   Pt denies any difficulty urinating, testicle pain, swelling, fever or other discharge.

## 2021-10-04 NOTE — ED Provider Notes (Signed)
[facility]    CSN: [id number] Arrival date & time: 10/04/21  1556      History   Chief Complaint Chief Complaint  Patient presents with   Penile Discharge    HPI Walter Tapia is a 26 y.o. male.   The patient is a 26 year old male who presents for STI testing.  All patient also complains of penile discharge that he saw 2 days ago.  Patient states that the discharge looked like it had blood in it.  He denies dysuria, urinary urgency, hesitancy, testicular/scrotal pain or swelling, or abdominal pain.  Patient states that his testicles "just do not feel right".  He states that has been going on for about 2 weeks.  Patient reports 1 male partner in the past 90 days.  Previous history of chlamydia. Patient also requesting HIV/RPR testing.  The history is provided by the patient.   Past Medical History:  Diagnosis Date   Allergy    COVID-19     There are no problems to display for this patient.   History reviewed. No pertinent surgical history.     Home Medications    Prior to Admission medications   Medication Sig Start Date End Date Taking? Authorizing Provider  acetaminophen (TYLENOL) 500 MG tablet Take 1 tablet (500 mg total) by mouth every 6 (six) hours as needed. 05/06/19   Domenic Moras, PA-C  benzonatate (TESSALON) 100 MG capsule Take 1 capsule (100 mg total) by mouth every 8 (eight) hours. 05/06/19   Domenic Moras, PA-C  ibuprofen (ADVIL) 600 MG tablet Take 1 tablet (600 mg total) by mouth every 6 (six) hours as needed. 05/26/19   Lamptey, Myrene Galas, MD  levocetirizine (XYZAL) 5 MG tablet Take 5 mg by mouth every evening.    [provider]    Family History Family History  Problem Relation Age of Onset   Diabetes Paternal Grandmother     Social History Social History   Tobacco Use   Smoking status: Never   Smokeless tobacco: Never  Substance Use Topics   Alcohol use: Yes    Comment: occ   Drug use: No     Allergies   Patient  has no known allergies.   Review of Systems Review of Systems PER HPI  Physical Exam Triage Vital Signs ED Triage Vitals  Enc Vitals Group     BP 10/04/21 1720 136/80     Pulse Rate 10/04/21 1720 83     Resp 10/04/21 1720 18     Temp 10/04/21 1720 99 F (37.2 C)     Temp Source 10/04/21 1720 Oral     SpO2 10/04/21 1720 97 %     Weight 10/04/21 1722 205 lb (93 kg)     Height 10/04/21 1722 5\' 10"  (1.778 m)     Head Circumference --      Peak Flow --      Pain Score 10/04/21 1720 0     Pain Loc --      Pain Edu? --      Excl. in [place]? --    No data found.  Updated Vital Signs BP 136/80 (BP Location: Right Arm)   Pulse 83   Temp 99 F (37.2 C) (Oral)   Resp 18   Ht 5\' 10"  (1.778 m)   Wt 205 lb (93 kg)   SpO2 97%   BMI 29.41 kg/m   Visual Acuity Right Eye Distance:   Left Eye Distance:   Bilateral Distance:  Right Eye Near:   Left Eye Near:    Bilateral Near:     Physical Exam Vitals and nursing note reviewed.  Constitutional:      Appearance: Normal appearance.  Cardiovascular:     Rate and Rhythm: Normal rate and regular rhythm.     Pulses: Normal pulses.     Heart sounds: Normal heart sounds.  Pulmonary:     Effort: Pulmonary effort is normal.     Breath sounds: Normal breath sounds.  Abdominal:     General: Bowel sounds are normal.     Palpations: Abdomen is soft.  Skin:    General: Skin is warm and dry.     Capillary Refill: Capillary refill takes less than 2 seconds.  Neurological:     General: No focal deficit present.     Mental Status: He is alert and oriented to person, place, and time.  Psychiatric:        Mood and Affect: Mood normal.        Behavior: Behavior normal.     UC Treatments / Results  Labs (all labs ordered are listed, but only abnormal results are displayed) Labs Reviewed  HIV ANTIBODY (ROUTINE TESTING W REFLEX)  RPR  POCT URINALYSIS DIP (MANUAL ENTRY)  CYTOLOGY, (ORAL, ANAL, URETHRAL) ANCILLARY ONLY     EKG   Radiology No results found.  Procedures Procedures (including critical care time)  Medications Ordered in UC Medications - No data to display  Initial Impression / Assessment and Plan / UC Course  I have reviewed the triage vital signs and the nursing notes.  Pertinent labs & imaging results that were available during my care of the patient were reviewed by me and considered in my medical decision making (see chart for details).  The patient is a 26 y.o. male who presents for penile discharge and STI testing. He reports one male partner in the past 90 days. Urinalysis was negative, urine culture pending.  Patient also requested HIV and syphilis testing today.  Patient advised that cytology results are pending, along with HIV/RPR test.  He will be contacted if the results are positive.  Patient encouraged to increase condom use.  Follow-up as needed. Final Clinical Impressions(s) / UC Diagnoses   Final diagnoses:  Screening examination for sexually transmitted disease     Discharge Instructions      Your urinalysis was negative today.  A urine culture has been ordered for confirmatory testing  Your cytology, HIV and syphilis results will be available within the next 24 to 48 hours.  If you have access to MyChart, you will be able to see the results there. Increase condom use.  Follow-up as needed.     ED Prescriptions   None    PDMP not reviewed this encounter.   Tish Men, NP 10/04/21 [phone]

## 2021-10-05 LAB — CYTOLOGY, (ORAL, ANAL, URETHRAL) ANCILLARY ONLY
Chlamydia: NEGATIVE
Comment: NEGATIVE
Comment: NEGATIVE
Comment: NORMAL
Neisseria Gonorrhea: NEGATIVE
Trichomonas: NEGATIVE

## 2021-10-05 LAB — RPR: RPR Ser Ql: NONREACTIVE

## 2021-10-05 LAB — HIV ANTIBODY (ROUTINE TESTING W REFLEX): HIV Screen 4th Generation wRfx: NONREACTIVE

## 2022-04-28 ENCOUNTER — Encounter: Payer: Self-pay | Admitting: Emergency Medicine

## 2022-04-28 ENCOUNTER — Ambulatory Visit
Admission: EM | Admit: 2022-04-28 | Discharge: 2022-04-28 | Disposition: A | Discharge status: Home / Self Care | Payer: BC Managed Care – PPO | Attending: Family Medicine | Admitting: Family Medicine

## 2022-04-28 DIAGNOSIS — Z8616 Personal history of COVID-19: Secondary | ICD-10-CM | POA: Insufficient documentation

## 2022-04-28 DIAGNOSIS — Z1152 Encounter for screening for COVID-19: Secondary | ICD-10-CM | POA: Diagnosis not present

## 2022-04-28 DIAGNOSIS — Z113 Encounter for screening for infections with a predominantly sexual mode of transmission: Secondary | ICD-10-CM | POA: Diagnosis not present

## 2022-04-28 DIAGNOSIS — J069 Acute upper respiratory infection, unspecified: Secondary | ICD-10-CM | POA: Diagnosis not present

## 2022-04-28 DIAGNOSIS — R059 Cough, unspecified: Secondary | ICD-10-CM | POA: Insufficient documentation

## 2022-04-28 DIAGNOSIS — Z87438 Personal history of other diseases of male genital organs: Secondary | ICD-10-CM | POA: Diagnosis not present

## 2022-04-28 DIAGNOSIS — J029 Acute pharyngitis, unspecified: Secondary | ICD-10-CM | POA: Diagnosis not present

## 2022-04-28 MED ORDER — MUPIROCIN 2 % EX OINT: 1.0000 | TOPICAL_OINTMENT | Freq: Two times a day (BID) | CUTANEOUS | 0 refills | Status: DC | PRN

## 2022-04-28 MED ORDER — PROMETHAZINE-DM 6.25-15 MG/5ML PO SYRP: 5.0000 mL | ORAL_SOLUTION | Freq: Four times a day (QID) | ORAL | 0 refills | Status: DC | PRN

## 2022-04-28 MED ORDER — FLUTICASONE PROPIONATE 50 MCG/ACT NA SUSP: 1.0000 | Freq: Two times a day (BID) | NASAL | 2 refills | Status: DC

## 2022-04-28 NOTE — ED Provider Notes (Signed)
[facility]    CSN: [id number] Arrival date & time: 04/28/22  1834      History   Chief Complaint Chief Complaint  Patient presents with   wants covid tested    HPI Walter Tapia is a 26 y.o. male.   Presenting today with a little over a week of cough, sore throat, congestion.  Denies fever, chills, chest pain, shortness of breath, abdominal pain, nausea vomiting or diarrhea.  Took some DayQuil and NyQuil initially but currently not taking anything for symptoms.  History of seasonal allergies not currently on anything for this.  Multiple sick family members in the household.  Also requesting STD testing, denies any new exposures or current symptoms.  He is also requesting a cream for occasional balanitis that he has been diagnosed for in the past.  He denies any current symptoms but states he gets some inflammation off-and-on.    Past Medical History:  Diagnosis Date   Allergy    COVID-19     There are no problems to display for this patient.   History reviewed. No pertinent surgical history.     Home Medications    Prior to Admission medications   Medication Sig Start Date End Date Taking? Authorizing Provider  fluticasone (FLONASE) 50 MCG/ACT nasal spray Place 1 spray into both nostrils 2 (two) times daily. 04/28/22  Yes Particia Nearing, PA-C  mupirocin ointment (BACTROBAN) 2 % Apply 1 Application topically 2 (two) times daily as needed. 04/28/22  Yes Particia Nearing, PA-C  promethazine-dextromethorphan (PROMETHAZINE-DM) 6.25-15 MG/5ML syrup Take 5 mLs by mouth 4 (four) times daily as needed. 04/28/22  Yes Particia Nearing, PA-C  acetaminophen (TYLENOL) 500 MG tablet Take 1 tablet (500 mg total) by mouth every 6 (six) hours as needed. 05/06/19   Fayrene Helper, PA-C  benzonatate (TESSALON) 100 MG capsule Take 1 capsule (100 mg total) by mouth every 8 (eight) hours. 05/06/19   Fayrene Helper, PA-C  ibuprofen (ADVIL) 600 MG tablet Take 1  tablet (600 mg total) by mouth every 6 (six) hours as needed. 05/26/19   Lamptey, Britta Mccreedy, MD  levocetirizine (XYZAL) 5 MG tablet Take 5 mg by mouth every evening.    [provider]    Family History Family History  Problem Relation Age of Onset   Diabetes Paternal Grandmother     Social History Social History   Tobacco Use   Smoking status: Never   Smokeless tobacco: Never  Substance Use Topics   Alcohol use: Yes    Comment: occ   Drug use: No     Allergies   Patient has no known allergies.   Review of Systems Review of Systems Per HPI  Physical Exam Triage Vital Signs ED Triage Vitals  Enc Vitals Group     BP 04/28/22 1936 135/81     Pulse Rate 04/28/22 1936 98     Resp 04/28/22 1936 18     Temp 04/28/22 1936 98 F (36.7 C)     Temp Source 04/28/22 1936 Oral     SpO2 04/28/22 1936 98 %     Weight --      Height --      Head Circumference --      Peak Flow --      Pain Score 04/28/22 1938 6     Pain Loc --      Pain Edu? --      Excl. in [place]? --    No data  found.  Updated Vital Signs BP 135/81 (BP Location: Right Arm)   Pulse 98   Temp 98 F (36.7 C) (Oral)   Resp 18   SpO2 98%   Visual Acuity Right Eye Distance:   Left Eye Distance:   Bilateral Distance:    Right Eye Near:   Left Eye Near:    Bilateral Near:     Physical Exam Vitals and nursing note reviewed.  Constitutional:      Appearance: Normal appearance. He is well-developed.  HENT:     Head: Atraumatic.     Right Ear: External ear normal.     Left Ear: External ear normal.     Nose: Rhinorrhea present.     Mouth/Throat:     Pharynx: Posterior oropharyngeal erythema present. No oropharyngeal exudate.  Eyes:     Extraocular Movements: Extraocular movements intact.     Conjunctiva/sclera: Conjunctivae normal.     Pupils: Pupils are equal, round, and reactive to light.  Cardiovascular:     Rate and Rhythm: Normal rate and regular rhythm.  Pulmonary:     Effort:  Pulmonary effort is normal. No respiratory distress.     Breath sounds: Normal breath sounds. No wheezing or rales.  Genitourinary:    Comments: GU exam deferred, self swab performed Musculoskeletal:        General: Normal range of motion.     Cervical back: Normal range of motion and neck supple.  Lymphadenopathy:     Cervical: No cervical adenopathy.  Skin:    General: Skin is warm and dry.  Neurological:     General: No focal deficit present.     Mental Status: He is alert and oriented to person, place, and time.  Psychiatric:        Mood and Affect: Mood normal.        Behavior: Behavior normal.        Thought Content: Thought content normal.        Judgment: Judgment normal.      UC Treatments / Results  Labs (all labs ordered are listed, but only abnormal results are displayed) Labs Reviewed  RESP PANEL BY RT-PCR (FLU A&B, COVID) ARPGX2  HIV ANTIBODY (ROUTINE TESTING W REFLEX)  RPR  CYTOLOGY, (ORAL, ANAL, URETHRAL) ANCILLARY ONLY    EKG   Radiology No results found.  Procedures Procedures (including critical care time)  Medications Ordered in UC Medications - No data to display  Initial Impression / Assessment and Plan / UC Course  I have reviewed the triage vital signs and the nursing notes.  Pertinent labs & imaging results that were available during my care of the patient were reviewed by me and considered in my medical decision making (see chart for details).     STD panel pending for screening purposes, mupirocin sent for occasional "balanitis "that he has been diagnosed within the past per patient.  He is asymptomatic currently so unable to officially diagnose this myself today.  Regarding his upper respiratory symptoms, respiratory panel pending, discussed Phenergan DM, Flonase, over-the-counter cold and congestion medications and allergy medication.  Return for worsening symptoms.  Final Clinical Impressions(s) / UC Diagnoses   Final diagnoses:   Routine screening for STI (sexually transmitted infection)  Viral URI with cough  History of balanitis   Discharge Instructions   None    ED Prescriptions     Medication Sig Dispense Auth. Provider   mupirocin ointment (BACTROBAN) 2 % Apply 1 Application topically 2 (two) times daily  as needed. [place], [place]   promethazine-dextromethorphan (PROMETHAZINE-DM) 6.25-15 MG/5ML syrup Take 5 mLs by mouth 4 (four) times daily as needed. 100 mL Volney American, PA-C   fluticasone [facility]) 50 MCG/ACT nasal spray Place 1 spray into both nostrils 2 (two) times daily. 16 g Volney American, [place]      PDMP not reviewed this encounter.   Volney American, [place] 04/28/22 2022

## 2022-04-28 NOTE — ED Triage Notes (Signed)
Cough and sore throat x 1.5 weeks.  Cough productive at times.

## 2022-04-28 NOTE — ED Triage Notes (Signed)
Also wants STD testing.  Denies discharge and pain

## 2022-04-29 LAB — RESP PANEL BY RT-PCR (FLU A&B, COVID) ARPGX2
Influenza A by PCR: NEGATIVE
Influenza B by PCR: NEGATIVE
SARS Coronavirus 2 by RT PCR: NEGATIVE

## 2022-04-30 LAB — RPR: RPR Ser Ql: NONREACTIVE

## 2022-04-30 LAB — HIV ANTIBODY (ROUTINE TESTING W REFLEX): HIV Screen 4th Generation wRfx: NONREACTIVE

## 2022-05-01 LAB — CYTOLOGY, (ORAL, ANAL, URETHRAL) ANCILLARY ONLY
Chlamydia: NEGATIVE
Comment: NEGATIVE
Comment: NEGATIVE
Comment: NORMAL
Neisseria Gonorrhea: NEGATIVE
Trichomonas: NEGATIVE

## 2022-09-15 DIAGNOSIS — R350 Frequency of micturition: Secondary | ICD-10-CM | POA: Diagnosis not present

## 2023-01-17 DIAGNOSIS — L853 Xerosis cutis: Secondary | ICD-10-CM | POA: Diagnosis not present

## 2023-01-17 DIAGNOSIS — R350 Frequency of micturition: Secondary | ICD-10-CM | POA: Diagnosis not present

## 2023-01-17 DIAGNOSIS — Z6832 Body mass index (BMI) 32.0-32.9, adult: Secondary | ICD-10-CM | POA: Diagnosis not present

## 2023-01-31 DIAGNOSIS — Z131 Encounter for screening for diabetes mellitus: Secondary | ICD-10-CM | POA: Diagnosis not present

## 2023-01-31 DIAGNOSIS — Z1321 Encounter for screening for nutritional disorder: Secondary | ICD-10-CM | POA: Diagnosis not present

## 2023-04-05 DIAGNOSIS — R319 Hematuria, unspecified: Secondary | ICD-10-CM | POA: Diagnosis not present

## 2023-04-05 DIAGNOSIS — Z6829 Body mass index (BMI) 29.0-29.9, adult: Secondary | ICD-10-CM | POA: Diagnosis not present

## 2023-04-23 DIAGNOSIS — D649 Anemia, unspecified: Secondary | ICD-10-CM | POA: Diagnosis not present

## 2023-04-23 DIAGNOSIS — Z1159 Encounter for screening for other viral diseases: Secondary | ICD-10-CM | POA: Diagnosis not present

## 2023-04-26 DIAGNOSIS — R7303 Prediabetes: Secondary | ICD-10-CM | POA: Diagnosis not present

## 2023-04-26 DIAGNOSIS — R319 Hematuria, unspecified: Secondary | ICD-10-CM | POA: Diagnosis not present

## 2023-04-26 DIAGNOSIS — Z6830 Body mass index (BMI) 30.0-30.9, adult: Secondary | ICD-10-CM | POA: Diagnosis not present

## 2023-05-28 DIAGNOSIS — R319 Hematuria, unspecified: Secondary | ICD-10-CM | POA: Diagnosis not present

## 2023-06-01 NOTE — Progress Notes (Signed)
Name: Walter Tapia DOB: 27-Oct-1995 MRN: [id number]  History of Present Illness: Mr. Walter Tapia is a 28 y.o. male who presents today as a new patient at [facility]. All available relevant medical records have been reviewed.  - GU history: 1. Prior episode(s) of balanitis. Has been treating with Bactroban previously. Reports that he is uncircumcised.   Referred to Urology for evaluation of microscopic hematuria.  Per chart review / scanned records: > 04/28/2022: STD testing negative for gonorrhea, chlamydia, trichomonas, syphilis, HIV.  > 04/05/2023:  - Seen by PCP for urinary frequency >6 months. - UA positive for blood; otherwise unremarkable. - Macrobid prescribed for possible UTI. - Urine culture came back negative.  > 04/23/2023:  - CBC unremarkable. - Negative for hepatitis C, syphilis, HIV.  > 04/26/2023:  - UA positive for blood; otherwise unremarkable. - Urine culture not done.  > No recent relevant imaging.  > No urine microscopy results within the past 12 months.  Today: He reports urinary urgency and frequency (up to 10x/day). Denies nocturia, dysuria, gross hematuria, hesitancy, straining to void, or sensations of incomplete emptying. He denies abdominal or flank pain. He denies caffeine intake. Reports being pre-diabetic.   He denies prior history of gross hematuria.  He denies history of kidney stones.  He denies history of pyelonephritis.  He denies history of recent or recurrent UTI. He denies history of GU malignancy or pelvic radiation.  He denies history of autoimmune disease. He denies history of sickle cell trait or disease. He denies history of smoking. He denies taking anticoagulants.   Fall Screening: Do you usually have a device to assist in your mobility? No   Medications: Current Outpatient Medications  Medication Sig Dispense Refill   clotrimazole (LOTRIMIN) 1 % cream Apply 1 Application topically 2 (two) times daily.  30 g 0   No current facility-administered medications for this visit.    Allergies: No Known Allergies  Past Medical History:  Diagnosis Date   Allergy    COVID-19    History reviewed. No pertinent surgical history. Family History  Problem Relation Age of Onset   Diabetes Paternal Grandmother    Social History   Socioeconomic History   Marital status: Single    Spouse name: Not on file   Number of children: Not on file   Years of education: Not on file   Highest education level: Not on file  Occupational History   Not on file  Tobacco Use   Smoking status: Never   Smokeless tobacco: Never  Substance and Sexual Activity   Alcohol use: Yes    Comment: occ   Drug use: No   Sexual activity: Yes    Partners: Female    Birth control/protection: Condom  Other Topics Concern   Not on file  Social History Narrative   Not on file   Social Drivers of Health   Financial Resource Strain: Not on file  Food Insecurity: Not on file  Transportation Needs: Not on file  Physical Activity: Not on file  Stress: Not on file  Social Connections: Not on file  Intimate Partner Violence: Not on file    SUBJECTIVE  Review of Systems Constitutional: Patient denies any unintentional weight loss or change in strength lntegumentary: Patient denies any rashes or pruritus Cardiovascular: Patient denies chest pain or syncope Respiratory: Patient denies shortness of breath Gastrointestinal: Patient denies nausea, vomiting, constipation, or diarrhea Musculoskeletal: Patient denies muscle cramps or weakness Neurologic: Patient denies convulsions or seizures  Allergic/Immunologic: Patient denies recent allergic reaction(s) Hematologic/Lymphatic: Patient denies bleeding tendencies Endocrine: Patient denies heat/cold intolerance  GU: As per HPI.  OBJECTIVE Vitals:   06/08/23 0920  BP: 118/72  Pulse: 75  Temp: 97.8 F (36.6 C)   There is no height or weight on file to calculate  BMI.  Physical Examination Constitutional: No obvious distress; patient is non-toxic appearing  Cardiovascular: No visible lower extremity edema.  Respiratory: The patient does not have audible wheezing/stridor; respirations do not appear labored  Gastrointestinal: Abdomen non-distended Musculoskeletal: Normal ROM of UEs  Skin: No obvious rashes/open sores  Neurologic: CN 2-12 grossly intact Psychiatric: Answered questions appropriately with normal affect  Hematologic/Lymphatic/Immunologic: No obvious bruises or sites of spontaneous bleeding  Urine microscopy: 3-10 RBC/hpf, otherwise unremarkable PVR: 0 ml  ASSESSMENT Microscopic hematuria - Plan: Urinalysis, Routine w reflex microscopic, BLADDER SCAN AMB NON-IMAGING, US RENAL  Urinary frequency - Plan: Urinalysis, Routine w reflex microscopic, BLADDER SCAN AMB NON-IMAGING, US RENAL  History of balanitis - Plan: clotrimazole (LOTRIMIN) 1 % cream  1. For microscopic hematuria we discussed possible etiologies including but not limited to: vigorous exercise, sexual activity, stone, trauma, urinary tract infection, urethral irritation, chronic kidney disease, glomerulonephropathy, BPH, malignancy. Based on AUA 2020 [employer] guideline he is considered low risk. Advised renal US. If negative, will likely then schedule lab visit to collect urine specimen for Cx Bladder Test to further risk stratify in order to determine whether or not cystoscopic evaluation is indicated.   2. Urinary urgency and frequency. Possible secondary to underlying etiology for #1 versus OAB. We discussed option to trial an anticholinergic medication and reviewed the potential benefits, risks, and side effects of that. He elected to hold off for now.   3. Prior episode(s) of balanitis. Advised Clotrimazole PRN instead of Bactroban PRN; prescription sent. Advised gentle daily cleansing underneath foreskin with soap / water and to keep area dry.  We agreed to plan for follow up  in 4 weeks or sooner if needed. Patient verbalized understanding of and agreement with current plan. All questions were answered.  PLAN Advised the following: Renal US within 1-2 weeks. Continue minimal caffeine intake. Clotrimazole PRN.   Return in about 4 weeks (around 07/06/2023) for UA, PVR, & f/u with Evette Georges NP.  Orders Placed This Encounter  Procedures   US RENAL    Standing Status:   Future    Expected Date:   06/08/2023    Expiration Date:   06/07/2024    Reason for Exam (SYMPTOM  OR DIAGNOSIS REQUIRED):   kidney stone known or suspected    Preferred imaging location?:   [facility]   Urinalysis, Routine w reflex microscopic   BLADDER SCAN AMB NON-IMAGING    It has been explained that the patient is to follow regularly with their PCP in addition to all other providers involved in their care and to follow instructions provided by these respective offices. Patient advised to contact urology clinic if any urologic-pertaining questions, concerns, new symptoms or problems arise in the interim period.  There are no Patient Instructions on file for this visit.  Electronically signed by:  Donnita Falls, MSN, FNP-C, CUNP 06/08/2023 10:08 AM

## 2023-06-08 ENCOUNTER — Ambulatory Visit (INDEPENDENT_AMBULATORY_CARE_PROVIDER_SITE_OTHER): Payer: BC Managed Care – PPO | Admitting: Urology

## 2023-06-08 ENCOUNTER — Encounter: Payer: Self-pay | Admitting: Urology

## 2023-06-08 VITALS — BP 118/72 | HR 75 | Temp 97.8°F

## 2023-06-08 DIAGNOSIS — Z87438 Personal history of other diseases of male genital organs: Secondary | ICD-10-CM | POA: Diagnosis not present

## 2023-06-08 DIAGNOSIS — R3129 Other microscopic hematuria: Secondary | ICD-10-CM | POA: Insufficient documentation

## 2023-06-08 DIAGNOSIS — R350 Frequency of micturition: Secondary | ICD-10-CM | POA: Insufficient documentation

## 2023-06-08 LAB — URINALYSIS, ROUTINE W REFLEX MICROSCOPIC
Bilirubin, UA: NEGATIVE
Glucose, UA: NEGATIVE
Ketones, UA: NEGATIVE
Leukocytes,UA: NEGATIVE
Nitrite, UA: NEGATIVE
Protein,UA: NEGATIVE
Specific Gravity, UA: 1.01 (ref 1.005–1.030)
Urobilinogen, Ur: 0.2 mg/dL (ref 0.2–1.0)
pH, UA: 6 (ref 5.0–7.5)

## 2023-06-08 LAB — MICROSCOPIC EXAMINATION: Bacteria, UA: NONE SEEN

## 2023-06-08 LAB — BLADDER SCAN AMB NON-IMAGING: Scan Result: 0

## 2023-06-08 MED ORDER — CLOTRIMAZOLE 1 % EX CREA: 1.0000 | TOPICAL_CREAM | Freq: Two times a day (BID) | CUTANEOUS | 0 refills | Status: AC

## 2023-06-12 ENCOUNTER — Ambulatory Visit (HOSPITAL_COMMUNITY)
Admission: RE | Admit: 2023-06-12 | Discharge: 2023-06-12 | Disposition: A | Discharge status: Home / Self Care | Payer: BC Managed Care – PPO | Source: Ambulatory Visit | Attending: Urology | Admitting: Urology

## 2023-06-12 DIAGNOSIS — R350 Frequency of micturition: Secondary | ICD-10-CM | POA: Diagnosis not present

## 2023-06-12 DIAGNOSIS — R3129 Other microscopic hematuria: Secondary | ICD-10-CM | POA: Diagnosis not present

## 2023-06-12 DIAGNOSIS — Z0389 Encounter for observation for other suspected diseases and conditions ruled out: Secondary | ICD-10-CM | POA: Diagnosis not present

## 2023-06-14 ENCOUNTER — Other Ambulatory Visit: Payer: BC Managed Care – PPO

## 2023-06-14 ENCOUNTER — Telehealth: Payer: Self-pay

## 2023-06-14 DIAGNOSIS — R3129 Other microscopic hematuria: Secondary | ICD-10-CM | POA: Diagnosis not present

## 2023-06-14 NOTE — Telephone Encounter (Signed)
Patient made aware and scheduled for urine drop off for CX bladder for further testing. Patient voiced understanding.

## 2023-06-14 NOTE — Telephone Encounter (Signed)
-----   Message from Donnita Falls sent at 06/13/2023  8:48 AM EST ----- Please let pt know RUS was normal - no stones, masses, or hydronephrosis. As discussed at LOV: Since nothing was found on imaging to explain his microscopic hematuria, please schedule lab visit to collect urine specimen for specialty "Cx Bladder Test" to further risk stratify in order to determine whether or not cystoscopic evaluation is indicated. Thanks!

## 2023-06-15 ENCOUNTER — Telehealth: Payer: Self-pay

## 2023-06-15 NOTE — Telephone Encounter (Signed)
Confirmation tracking # [id number] (CX bladder test)

## 2023-07-10 ENCOUNTER — Encounter: Payer: Self-pay | Admitting: Urology

## 2023-07-10 ENCOUNTER — Ambulatory Visit (INDEPENDENT_AMBULATORY_CARE_PROVIDER_SITE_OTHER): Payer: BC Managed Care – PPO | Admitting: Urology

## 2023-07-10 VITALS — BP 116/79 | HR 73 | Temp 98.5°F

## 2023-07-10 DIAGNOSIS — R682 Dry mouth, unspecified: Secondary | ICD-10-CM

## 2023-07-10 DIAGNOSIS — R350 Frequency of micturition: Secondary | ICD-10-CM

## 2023-07-10 DIAGNOSIS — R3129 Other microscopic hematuria: Secondary | ICD-10-CM | POA: Diagnosis not present

## 2023-07-10 DIAGNOSIS — R3915 Urgency of urination: Secondary | ICD-10-CM | POA: Diagnosis not present

## 2023-07-10 DIAGNOSIS — Z87438 Personal history of other diseases of male genital organs: Secondary | ICD-10-CM | POA: Diagnosis not present

## 2023-07-10 MED ORDER — TOLTERODINE TARTRATE ER 2 MG PO CP24: 2.0000 mg | ORAL_CAPSULE | Freq: Every day | ORAL | 5 refills | Status: DC

## 2023-07-10 NOTE — Progress Notes (Signed)
 Name: Walter Tapia DOB: 12/12/1995 MRN: [id number]  History of Present Illness: Walter Tapia is a 28 y.o. male who presents today for follow up visit at [facility] [place].  - GU history: 1. Prior episode(s) of balanitis. Has been treating with Bactroban previously. Reports that he is uncircumcised.    At initial visit on 06/08/2023: - Seen for microscopic hematuria. - Reported urinary urgency and frequency (up to 10x/day). Denied caffeine intake, nocturia, dysuria, gross hematuria, hesitancy, straining to void, or sensations of incomplete emptying. - Urine microscopy: 3-10 RBC/hpf, otherwise unremarkable. - PVR = 0 ml. - The plan was:  1. For microscopic hematuria: Based on AUA 2020 [facility] guideline he is considered low risk. Advised renal US. If negative, will likely then schedule lab visit to collect urine specimen for Cx Bladder Test to further risk stratify in order to determine whether or not cystoscopic evaluation is indicated.  2. For urinary urgency and frequency: Possible secondary to underlying etiology for #1 versus OAB. We discussed option to trial an anticholinergic medication and reviewed the potential benefits, risks, and side effects of that. He elected to hold off for now. Advised to continue minimal caffeine intake.  3. For balanitis: Clotrimazole PRN prescribed.  Since last visit: > 06/12/2023: RUS was normal - no stones, masses, or hydronephrosis.   > 06/14/2023: His Cx Bladder Triage score was 3.14. Scores <4.0 indicate a low probability of urothelial carcinoma with a negative predictive value of 98.5% and a sensitivity of 95.1% in low risk patients with microscopic hematuria.   Today: He reports persistent bothersome urinary urgency and frequency. Voiding at least once per hour on average. Denies nocturia, incontinence, dysuria, gross hematuria, hesitancy, straining to void, or sensations of incomplete emptying. He denies caffeine intake.  Denies any recent  balanitis flare ups.   Fall Screening: Do you usually have a device to assist in your mobility? No   Medications: Current Outpatient Medications  Medication Sig Dispense Refill   clotrimazole (LOTRIMIN) 1 % cream Apply 1 Application topically 2 (two) times daily. 30 g 0   tolterodine (DETROL LA) 2 MG 24 hr capsule Take 1 capsule (2 mg total) by mouth daily. 30 capsule 5   No current facility-administered medications for this visit.    Allergies: No Known Allergies  Past Medical History:  Diagnosis Date   Allergy    COVID-19    History reviewed. No pertinent surgical history. Family History  Problem Relation Age of Onset   Diabetes Paternal Grandmother    Social History   Socioeconomic History   Marital status: Single    Spouse name: Not on file   Number of children: Not on file   Years of education: Not on file   Highest education level: Not on file  Occupational History   Not on file  Tobacco Use   Smoking status: Never   Smokeless tobacco: Never  Substance and Sexual Activity   Alcohol use: Yes    Comment: occ   Drug use: No   Sexual activity: Yes    Partners: Female    Birth control/protection: Condom  Other Topics Concern   Not on file  Social History Narrative   Not on file   Social Drivers of Health   Financial Resource Strain: Not on file  Food Insecurity: Not on file  Transportation Needs: Not on file  Physical Activity: Not on file  Stress: Not on file  Social Connections: Not on file  Intimate Partner Violence: Not  on file    Review of Systems Constitutional: Patient denies any unintentional weight loss or change in strength lntegumentary: Patient denies any rashes or pruritus Cardiovascular: Patient denies chest pain or syncope Respiratory: Patient denies shortness of breath Gastrointestinal: Patient denies nausea, vomiting, constipation, or diarrhea. Reports dry mouth.  Musculoskeletal: Patient denies muscle cramps or  weakness Neurologic: Patient denies convulsions or seizures Allergic/Immunologic: Patient denies recent allergic reaction(s) Hematologic/Lymphatic: Patient denies bleeding tendencies Endocrine: Patient denies heat/cold intolerance  GU: As per HPI.  OBJECTIVE Vitals:   07/10/23 1527  BP: 116/79  Pulse: 73  Temp: 98.5 F (36.9 C)   There is no height or weight on file to calculate BMI.  Physical Examination Constitutional: No obvious distress; patient is non-toxic appearing  Cardiovascular: No visible lower extremity edema.  Respiratory: The patient does not have audible wheezing/stridor; respirations do not appear labored  Gastrointestinal: Abdomen non-distended Musculoskeletal: Normal ROM of UEs  Skin: No obvious rashes/open sores  Neurologic: CN 2-12 grossly intact Psychiatric: Answered questions appropriately with normal affect  Hematologic/Lymphatic/Immunologic: No obvious bruises or sites of spontaneous bleeding  UA: negative  PVR: 0 ml  ASSESSMENT Microscopic hematuria - Plan: Urinalysis, Routine w reflex microscopic, BLADDER SCAN AMB NON-IMAGING  Urinary frequency - Plan: Urinalysis, Routine w reflex microscopic, BLADDER SCAN AMB NON-IMAGING, tolterodine (DETROL LA) 2 MG 24 hr capsule  Urinary urgency - Plan: Urinalysis, Routine w reflex microscopic, BLADDER SCAN AMB NON-IMAGING, tolterodine (DETROL LA) 2 MG 24 hr capsule  History of balanitis - Plan: Urinalysis, Routine w reflex microscopic, BLADDER SCAN AMB NON-IMAGING  Dry mouth  For microscopic hematuria: We reviewed the results of his renal US and Cx Bladder Triage test. Patient was informed that that the results from both tests were reassuring and that today's urine is normal, therefore further evaluation (such as cystoscopy or CT) is not indicated at this time.   For OAB with urinary urgency and frequency: We discussed option to trial an anticholinergic medication and reviewed the potential benefits, risks,  and side effects. He elected to start Detrol (Tolterodine) 2 mg daily. Advised to continue minimal caffeine intake also.   For balanitis: Advised to continue Clotrimazole PRN as prescribed.  We agreed to plan for follow up in 6 months or sooner if needed. Patient verbalized understanding of and agreement with current plan. All questions were answered.  PLAN Advised the following: 1. Start Detrol (Tolterodine) 2 mg daily. 2. Continue minimal caffeine intake. 3. Clotrimazole PRN for balanitis. 4. Gentle cleansing underneath foreskin with soap / water once daily; keep area dry otherwise.  5. Return in about 8 weeks (around 09/04/2023) for UA, PVR, & f/u with Evette Georges NP.  Orders Placed This Encounter  Procedures   Urinalysis, Routine w reflex microscopic   BLADDER SCAN AMB NON-IMAGING    It has been explained that the patient is to follow regularly with their PCP in addition to all other providers involved in their care and to follow instructions provided by these respective offices. Patient advised to contact urology clinic if any urologic-pertaining questions, concerns, new symptoms or problems arise in the interim period.  There are no Patient Instructions on file for this visit.  Electronically signed by:  Donnita Falls, FNP   07/10/23    4:08 PM

## 2023-07-11 LAB — URINALYSIS, ROUTINE W REFLEX MICROSCOPIC
Bilirubin, UA: NEGATIVE
Glucose, UA: NEGATIVE
Ketones, UA: NEGATIVE
Leukocytes,UA: NEGATIVE
Nitrite, UA: NEGATIVE
Protein,UA: NEGATIVE
RBC, UA: NEGATIVE
Specific Gravity, UA: 1.01 (ref 1.005–1.030)
Urobilinogen, Ur: 0.2 mg/dL (ref 0.2–1.0)
pH, UA: 6.5 (ref 5.0–7.5)

## 2023-07-31 DIAGNOSIS — D649 Anemia, unspecified: Secondary | ICD-10-CM | POA: Diagnosis not present

## 2023-07-31 DIAGNOSIS — R7303 Prediabetes: Secondary | ICD-10-CM | POA: Diagnosis not present

## 2023-09-04 ENCOUNTER — Encounter: Payer: Self-pay | Admitting: Urology

## 2023-09-04 ENCOUNTER — Ambulatory Visit (INDEPENDENT_AMBULATORY_CARE_PROVIDER_SITE_OTHER): Payer: BC Managed Care – PPO | Admitting: Urology

## 2023-09-04 VITALS — BP 115/66 | HR 71

## 2023-09-04 DIAGNOSIS — R350 Frequency of micturition: Secondary | ICD-10-CM

## 2023-09-04 DIAGNOSIS — R3129 Other microscopic hematuria: Secondary | ICD-10-CM | POA: Diagnosis not present

## 2023-09-04 DIAGNOSIS — Z87438 Personal history of other diseases of male genital organs: Secondary | ICD-10-CM | POA: Diagnosis not present

## 2023-09-04 DIAGNOSIS — R3915 Urgency of urination: Secondary | ICD-10-CM | POA: Diagnosis not present

## 2023-09-04 LAB — BLADDER SCAN AMB NON-IMAGING: Scan Result: 166

## 2023-09-04 MED ORDER — TOLTERODINE TARTRATE ER 4 MG PO CP24: 4.0000 mg | ORAL_CAPSULE | Freq: Every day | ORAL | 5 refills | Status: DC

## 2023-09-04 NOTE — Progress Notes (Signed)
 Name: Walter Tapia DOB: 1995/09/17 MRN: [id number]  History of Present Illness: [name] is a 28 y.o. male who presents today for follow up visit at [facility] [place].  GU History includes: 1. Microscopic hematuria; negative workup. - 06/12/2023: RUS was normal - no stones, masses, or hydronephrosis.  - 06/14/2023: Cx Bladder Triage score = 3.14, indicating very low probability of urothelial carcinoma in low risk patients such as this.  2. OAB with urinary urgency and frequency. 3. Prior episodic balanitis. - Has Clotrimazole  for PRN use.   At last visit on 07/10/2023: The plan was: 1. Start Detrol  (Tolterodine ) 2 mg daily. 2. Continue minimal caffeine intake. 3. Clotrimazole  PRN for balanitis. 4. Gentle cleansing underneath foreskin with soap / water once daily; keep area dry otherwise.  5. Return in about 8 weeks (around 09/04/2023) for UA, PVR, & f/u with Griselda Lederer NP.  Today: He reports ongoing bother from urinary frequency and urgency. Has had some improvement however as he now reports that he can wait up to 2-3 hours between voids compared to prior when he reported voiding at least once per hour on average. Denies any noticeable side effects from the Detrol .    Medications: Current Outpatient Medications  Medication Sig Dispense Refill   clotrimazole  (LOTRIMIN ) 1 % cream Apply 1 Application topically 2 (two) times daily. 30 g 0   tolterodine  (DETROL  LA) 4 MG 24 hr capsule Take 1 capsule (4 mg total) by mouth daily. 30 capsule 5   No current facility-administered medications for this visit.    Allergies: No Known Allergies  Past Medical History:  Diagnosis Date   Allergy    COVID-19    History reviewed. No pertinent surgical history. Family History  Problem Relation Age of Onset   Diabetes Paternal Grandmother    Social History   Socioeconomic History   Marital status: Single    Spouse name: Not on file   Number of children: Not on file    Years of education: Not on file   Highest education level: Not on file  Occupational History   Not on file  Tobacco Use   Smoking status: Never   Smokeless tobacco: Never  Substance and Sexual Activity   Alcohol use: Yes    Comment: occ   Drug use: No   Sexual activity: Yes    Partners: Female    Birth control/protection: Condom  Other Topics Concern   Not on file  Social History Narrative   Not on file   Social Drivers of Health   Financial Resource Strain: Not on file  Food Insecurity: Not on file  Transportation Needs: Not on file  Physical Activity: Not on file  Stress: Not on file  Social Connections: Not on file  Intimate Partner Violence: Not on file    Review of Systems Constitutional: Patient denies any unintentional weight loss or change in strength lntegumentary: Patient denies any rashes or pruritus Cardiovascular: Patient denies chest pain or syncope Respiratory: Patient denies shortness of breath Gastrointestinal: Patient denies constipation Musculoskeletal: Patient denies muscle cramps or weakness Neurologic: Patient denies convulsions or seizures Allergic/Immunologic: Patient denies recent allergic reaction(s) Hematologic/Lymphatic: Patient denies bleeding tendencies Endocrine: Patient denies heat/cold intolerance  GU: As per HPI.  OBJECTIVE Vitals:   09/04/23 1547  BP: 115/66  Pulse: 71   There is no height or weight on file to calculate BMI.  Physical Examination Constitutional: No obvious distress; patient is non-toxic appearing  Cardiovascular: No visible lower extremity  edema.  Respiratory: The patient does not have audible wheezing/stridor; respirations do not appear labored  Gastrointestinal: Abdomen non-distended Musculoskeletal: Normal ROM of UEs  Skin: No obvious rashes/open sores  Neurologic: CN 2-12 grossly intact Psychiatric: Answered questions appropriately with normal affect  Hematologic/Lymphatic/Immunologic: No obvious  bruises or sites of spontaneous bleeding  UA: negative  PVR: 166 ml  ASSESSMENT Microscopic hematuria - Plan: Urinalysis, Routine w reflex microscopic, BLADDER SCAN AMB NON-IMAGING  Urinary frequency - Plan: Urinalysis, Routine w reflex microscopic, BLADDER SCAN AMB NON-IMAGING, tolterodine  (DETROL  LA) 4 MG 24 hr capsule  Urinary urgency - Plan: Urinalysis, Routine w reflex microscopic, BLADDER SCAN AMB NON-IMAGING, tolterodine  (DETROL  LA) 4 MG 24 hr capsule  History of balanitis  We agreed to trial increasing dose to Detrol  (Tolterodine ) 4 mg daily. He was advised to continue avoiding caffeine and to work on timed voiding / bladder retraining. Will plan for follow up in 6-8 weeks for recheck. Patient verbalized understanding of and agreement with current plan. All questions were answered.  PLAN Advised the following: 1. Detrol  (Tolterodine ) 4 mg daily.  2. Avoid caffeine intake. 3. Work on timed voiding / bladder retraining. 4. Return for UA, PVR, & f/u with Griselda Lederer NP in 6-8 weeks.  Orders Placed This Encounter  Procedures   Urinalysis, Routine w reflex microscopic   BLADDER SCAN AMB NON-IMAGING    It has been explained that the patient is to follow regularly with their PCP in addition to all other providers involved in their care and to follow instructions provided by these respective offices. Patient advised to contact urology clinic if any urologic-pertaining questions, concerns, new symptoms or problems arise in the interim period.  There are no Patient Instructions on file for this visit.  Electronically signed by:  Lauretta Ponto, FNP   09/04/23    4:21 PM

## 2023-09-05 LAB — URINALYSIS, ROUTINE W REFLEX MICROSCOPIC
Bilirubin, UA: NEGATIVE
Glucose, UA: NEGATIVE
Ketones, UA: NEGATIVE
Leukocytes,UA: NEGATIVE
Nitrite, UA: NEGATIVE
Protein,UA: NEGATIVE
RBC, UA: NEGATIVE
Specific Gravity, UA: 1.015 (ref 1.005–1.030)
Urobilinogen, Ur: 1 mg/dL (ref 0.2–1.0)
pH, UA: 7.5 (ref 5.0–7.5)

## 2023-10-17 NOTE — Progress Notes (Deleted)
 Name: Walter Tapia DOB: 08/27/1995 MRN: [id number]  History of Present Illness: Mr. Diaz is a 28 y.o. male who presents today for follow up visit at [facility] [place].  Relevant History includes: 1. Microscopic hematuria; negative workup. - 06/12/2023: RUS was normal - no stones, masses, or hydronephrosis.  - 06/14/2023: Cx Bladder Triage score = 3.14, indicating very low probability of urothelial carcinoma in low risk patients such as this.  2. OAB with urinary urgency and frequency. 3. Prior episodic balanitis. - Has Clotrimazole  for PRN use.   At last visit on 09/04/2023: - [name] bothered from urinary frequency and urgency but did have some improvement with Detrol  2 mg.  - The plan was: 1. Detrol  (Tolterodine ) 4 mg daily (increased from 2 mg). 2. Avoid caffeine intake. 3. Work on timed voiding / bladder retraining. 4. Return for UA, PVR, & f/u with Griselda Lederer NP in 6-8 weeks.  Today: He {Actions; denies-reports:120008} increased urinary urgency, frequency, nocturia, dysuria, gross hematuria, hesitancy, straining to void, or sensations of incomplete emptying.   Medications: Current Outpatient Medications  Medication Sig Dispense Refill   clotrimazole  (LOTRIMIN ) 1 % cream Apply 1 Application topically 2 (two) times daily. 30 g 0   tolterodine  (DETROL  LA) 4 MG 24 hr capsule Take 1 capsule (4 mg total) by mouth daily. 30 capsule 5   No current facility-administered medications for this visit.    Allergies: No Known Allergies  Past Medical History:  Diagnosis Date   Allergy    COVID-19    No past surgical history on file. Family History  Problem Relation Age of Onset   Diabetes Paternal Grandmother    Social History   Socioeconomic History   Marital status: Single    Spouse name: Not on file   Number of children: Not on file   Years of education: Not on file   Highest education level: Not on file  Occupational History   Not on file  Tobacco  Use   Smoking status: Never   Smokeless tobacco: Never  Substance and Sexual Activity   Alcohol use: Yes    Comment: occ   Drug use: No   Sexual activity: Yes    Partners: Female    Birth control/protection: Condom  Other Topics Concern   Not on file  Social History Narrative   Not on file   Social Drivers of Health   Financial Resource Strain: Not on file  Food Insecurity: Not on file  Transportation Needs: Not on file  Physical Activity: Not on file  Stress: Not on file  Social Connections: Not on file  Intimate Partner Violence: Not on file    Review of Systems Constitutional: Patient denies any unintentional weight loss or change in strength lntegumentary: Patient denies any rashes or pruritus Cardiovascular: Patient denies chest pain or syncope Respiratory: Patient denies shortness of breath Gastrointestinal: ***Patient denies nausea, vomiting, constipation, or diarrhea ***As per HPI Musculoskeletal: Patient denies muscle cramps or weakness Neurologic: Patient denies convulsions or seizures Allergic/Immunologic: Patient denies recent allergic reaction(s) Hematologic/Lymphatic: Patient denies bleeding tendencies Endocrine: Patient denies heat/cold intolerance  GU: As per HPI.  OBJECTIVE There were no vitals filed for this visit. There is no height or weight on file to calculate BMI.  Physical Examination Constitutional: No obvious distress; patient is non-toxic appearing  Cardiovascular: No visible lower extremity edema.  Respiratory: The patient does not have audible wheezing/stridor; respirations do not appear labored  Gastrointestinal: Abdomen non-distended Musculoskeletal: Normal ROM of UEs  Skin: No obvious rashes/open sores  Neurologic: CN 2-12 grossly intact Psychiatric: Answered questions appropriately with normal affect  Hematologic/Lymphatic/Immunologic: No obvious bruises or sites of spontaneous bleeding  UA: ***negative ***positive for ***  leukocytes, *** blood, ***nitrites Urine microscopy: *** WBC/hpf, *** RBC/hpf, *** bacteria ***glucosuria (secondary to ***Jardiance ***Farxiga use) ***otherwise unremarkable  PVR: *** ml  ASSESSMENT No diagnosis found. ***  We agreed to plan for follow up in *** months / ***1 year or sooner if needed. Patient verbalized understanding of and agreement with current plan. All questions were answered.  PLAN Advised the following: 1. *** 2. ***No follow-ups on file.  No orders of the defined types were placed in this encounter.   It has been explained that the patient is to follow regularly with their PCP in addition to all other providers involved in their care and to follow instructions provided by these respective offices. Patient advised to contact urology clinic if any urologic-pertaining questions, concerns, new symptoms or problems arise in the interim period.  There are no Patient Instructions on file for this visit.  Electronically signed by:  Lauretta Ponto, FNP   10/17/23    1:22 PM

## 2023-10-18 ENCOUNTER — Ambulatory Visit: Admitting: Urology

## 2023-10-18 DIAGNOSIS — Z87438 Personal history of other diseases of male genital organs: Secondary | ICD-10-CM

## 2023-10-18 DIAGNOSIS — R350 Frequency of micturition: Secondary | ICD-10-CM

## 2023-10-18 DIAGNOSIS — R3915 Urgency of urination: Secondary | ICD-10-CM

## 2023-11-26 DIAGNOSIS — N3281 Overactive bladder: Secondary | ICD-10-CM | POA: Insufficient documentation

## 2023-11-26 NOTE — Progress Notes (Unsigned)
 Name: Walter Tapia DOB: 1996-05-15 MRN: [id number]  History of Present Illness: Walter Tapia is a 28 y.o. male who presents today for follow up visit at [facility] [place].  Relevant History includes: 1. Microscopic hematuria; negative workup. - 06/12/2023: RUS was normal - no stones, masses, or hydronephrosis.  - 06/14/2023: Cx Bladder Triage score = 3.14, indicating very low probability of urothelial carcinoma in low risk patients such as this.  2. OAB with urinary urgency and frequency. - Reports dry mouth at baseline. 3. Prior episodic balanitis. - Has Clotrimazole  for PRN use.   At last visit on 09/04/2023: - [name] bothered from urinary frequency and urgency but did have some improvement with Detrol  2 mg.  - The plan was: 1. Detrol  (Tolterodine ) 4 mg daily (increased from 2 mg). 2. Avoid caffeine intake. 3. Work on timed voiding / bladder retraining. 4. Return for UA, PVR, & f/u with Lauraine Oz NP in 6-8 weeks.  Today: He denies symptomatic improvement since increasing Detrol  (Tolterodine ) from 2mg  to 4 mg daily. He reports persistent / unchanged urinary frequency and urgency. Denies nocturia or urge incontinence. Voiding approximately 10x/day or more. Reports infrequent caffeine consumption.   He denies dysuria, gross hematuria, straining to void, or sensations of incomplete emptying. He denies flank pain or abdominal pain.  Denies any recent balanitis episodes.   Medications: Current Outpatient Medications  Medication Sig Dispense Refill   solifenacin  (VESICARE ) 10 MG tablet Take 1 tablet (10 mg total) by mouth daily. 30 tablet 11   clotrimazole  (LOTRIMIN ) 1 % cream Apply 1 Application topically 2 (two) times daily. (Patient not taking: Reported on 11/27/2023) 30 g 0   No current facility-administered medications for this visit.    Allergies: No Known Allergies  Past Medical History:  Diagnosis Date   Allergy    COVID-19    History reviewed. No  pertinent surgical history. Family History  Problem Relation Age of Onset   Diabetes Paternal Grandmother    Social History   Socioeconomic History   Marital status: Single    Spouse name: Not on file   Number of children: Not on file   Years of education: Not on file   Highest education level: Not on file  Occupational History   Not on file  Tobacco Use   Smoking status: Never   Smokeless tobacco: Never  Substance and Sexual Activity   Alcohol use: Yes    Comment: occ   Drug use: No   Sexual activity: Yes    Partners: Female    Birth control/protection: Condom  Other Topics Concern   Not on file  Social History Narrative   Not on file   Social Drivers of Health   Financial Resource Strain: Not on file  Food Insecurity: Not on file  Transportation Needs: Not on file  Physical Activity: Not on file  Stress: Not on file  Social Connections: Not on file  Intimate Partner Violence: Not on file    Review of Systems Constitutional: Patient denies any unintentional weight loss or change in strength lntegumentary: Patient denies any rashes or pruritus Cardiovascular: Patient denies chest pain or syncope Respiratory: Patient denies shortness of breath Gastrointestinal: Patient denies nausea, vomiting, constipation, or diarrhea  Musculoskeletal: Patient denies muscle cramps or weakness Neurologic: Patient denies convulsions or seizures Allergic/Immunologic: Patient denies recent allergic reaction(s) Hematologic/Lymphatic: Patient denies bleeding tendencies Endocrine: Patient denies heat/cold intolerance  GU: As per HPI.  OBJECTIVE Vitals:   11/27/23 1535  BP: 115/75  Pulse: 78   There is no height or weight on file to calculate BMI.  Physical Examination Constitutional: No obvious distress; patient is non-toxic appearing  Cardiovascular: No visible lower extremity edema.  Respiratory: The patient does not have audible wheezing/stridor; respirations do not  appear labored Gastrointestinal: Abdomen non-distended Musculoskeletal: Normal ROM of UEs Skin: No obvious rashes/open sores Neurologic: CN 2-12 grossly intact Psychiatric: Answered questions appropriately with normal affect  Hematologic/Lymphatic/Immunologic: No obvious bruises or sites of spontaneous bleeding  Urine microscopy: 3-10 RBC/hpf, otherwise unremarkable PVR: 2 ml  ASSESSMENT Microscopic hematuria - Plan: Urinalysis, Routine w reflex microscopic  OAB (overactive bladder) - Plan: Urinalysis, Routine w reflex microscopic, solifenacin  (VESICARE ) 10 MG tablet  Urinary frequency - Plan: Urinalysis, Routine w reflex microscopic, BLADDER SCAN AMB NON-IMAGING, solifenacin  (VESICARE ) 10 MG tablet  Urinary urgency - Plan: solifenacin  (VESICARE ) 10 MG tablet  History of balanitis - Plan: Urinalysis, Routine w reflex microscopic  Advised cystoscopy for further evaluation due to persistent microscopic hematuria and bothersome LUTS (urgency and frequency). Will switch from Detrol  to Vesicare  in the meantime to see if that offers improved symptom relief. Patient verbalized understanding of and agreement with current plan. All questions were answered.  PLAN Advised the following: 1. Discontinue Detrol . Start Vesicare  (Solifenacin ) 10 mg daily. 2. Continue to minimize / avoid caffeine intake. 3. Return for 1st available cystoscopy with any urology MD.  Orders Placed This Encounter  Procedures   Urinalysis, Routine w reflex microscopic   BLADDER SCAN AMB NON-IMAGING    It has been explained that the patient is to follow regularly with their PCP in addition to all other providers involved in their care and to follow instructions provided by these respective offices. Patient advised to contact urology clinic if any urologic-pertaining questions, concerns, new symptoms or problems arise in the interim period.  There are no Patient Instructions on file for this visit.  Electronically  signed by:  Lauraine JAYSON Oz, FNP   11/27/23    3:54 PM

## 2023-11-27 ENCOUNTER — Ambulatory Visit (INDEPENDENT_AMBULATORY_CARE_PROVIDER_SITE_OTHER): Admitting: Urology

## 2023-11-27 ENCOUNTER — Encounter: Payer: Self-pay | Admitting: Urology

## 2023-11-27 VITALS — BP 115/75 | HR 78

## 2023-11-27 DIAGNOSIS — R3915 Urgency of urination: Secondary | ICD-10-CM | POA: Insufficient documentation

## 2023-11-27 DIAGNOSIS — N3281 Overactive bladder: Secondary | ICD-10-CM

## 2023-11-27 DIAGNOSIS — R3129 Other microscopic hematuria: Secondary | ICD-10-CM

## 2023-11-27 DIAGNOSIS — R350 Frequency of micturition: Secondary | ICD-10-CM

## 2023-11-27 DIAGNOSIS — Z87438 Personal history of other diseases of male genital organs: Secondary | ICD-10-CM | POA: Diagnosis not present

## 2023-11-27 LAB — BLADDER SCAN AMB NON-IMAGING: Scan Result: 2

## 2023-11-27 MED ORDER — SOLIFENACIN SUCCINATE 10 MG PO TABS: 10.0000 mg | ORAL_TABLET | Freq: Every day | ORAL | 11 refills | Status: AC

## 2023-11-28 LAB — MICROSCOPIC EXAMINATION
Bacteria, UA: NONE SEEN
WBC, UA: NONE SEEN /HPF (ref 0–5)

## 2023-11-28 LAB — URINALYSIS, ROUTINE W REFLEX MICROSCOPIC
Bilirubin, UA: NEGATIVE
Glucose, UA: NEGATIVE
Ketones, UA: NEGATIVE
Leukocytes,UA: NEGATIVE
Nitrite, UA: NEGATIVE
Protein,UA: NEGATIVE
Specific Gravity, UA: 1.02 (ref 1.005–1.030)
Urobilinogen, Ur: 0.2 mg/dL (ref 0.2–1.0)
pH, UA: 6.5 (ref 5.0–7.5)

## 2024-01-15 DIAGNOSIS — Z1321 Encounter for screening for nutritional disorder: Secondary | ICD-10-CM | POA: Diagnosis not present

## 2024-01-15 DIAGNOSIS — Z13220 Encounter for screening for lipoid disorders: Secondary | ICD-10-CM | POA: Diagnosis not present

## 2024-01-15 DIAGNOSIS — R7303 Prediabetes: Secondary | ICD-10-CM | POA: Diagnosis not present

## 2024-01-21 DIAGNOSIS — Z0001 Encounter for general adult medical examination with abnormal findings: Secondary | ICD-10-CM | POA: Diagnosis not present

## 2024-01-21 DIAGNOSIS — Z6829 Body mass index (BMI) 29.0-29.9, adult: Secondary | ICD-10-CM | POA: Diagnosis not present

## 2024-01-21 DIAGNOSIS — E559 Vitamin D deficiency, unspecified: Secondary | ICD-10-CM | POA: Diagnosis not present

## 2024-01-21 DIAGNOSIS — D72829 Elevated white blood cell count, unspecified: Secondary | ICD-10-CM | POA: Diagnosis not present

## 2024-01-21 DIAGNOSIS — R319 Hematuria, unspecified: Secondary | ICD-10-CM | POA: Diagnosis not present

## 2024-01-23 ENCOUNTER — Encounter: Payer: Self-pay | Admitting: Urology

## 2024-01-23 ENCOUNTER — Ambulatory Visit: Admitting: Urology

## 2024-01-23 VITALS — BP 135/68 | HR 73

## 2024-01-23 DIAGNOSIS — R39198 Other difficulties with micturition: Secondary | ICD-10-CM

## 2024-01-23 DIAGNOSIS — R3129 Other microscopic hematuria: Secondary | ICD-10-CM | POA: Diagnosis not present

## 2024-01-23 MED ORDER — DOXYCYCLINE HYCLATE 100 MG PO CAPS: 100.0000 mg | ORAL_CAPSULE | Freq: Two times a day (BID) | ORAL | 0 refills | Status: AC

## 2024-01-23 MED ORDER — TAMSULOSIN HCL 0.4 MG PO CAPS: 0.4000 mg | ORAL_CAPSULE | Freq: Every day | ORAL | 11 refills | Status: AC

## 2024-01-23 MED ORDER — CIPROFLOXACIN HCL 500 MG PO TABS
500.0000 mg | ORAL_TABLET | Freq: Once | ORAL | Status: AC
  Administered 2024-01-23: 500 mg via ORAL

## 2024-01-23 NOTE — Patient Instructions (Signed)
 Prostatitis  Prostatitis is swelling of the prostate gland, also called the prostate. This gland is about 1.5 inches wide and 1 inch high, and it helps to make a fluid called semen. The prostate is below a man's bladder, in front of the butt (rectum). There are different types of prostatitis. What are the causes? One type of prostatitis is caused by an infection from germs (bacteria). Another type is not caused by germs. It may be caused by: Things having to do with the nervous system. This system includes thebrain, spinal cord, and nerves. An autoimmune response. This happens when the body's disease-fighting system attacks healthy tissue in the body by mistake. Psychological factors. These have to do with how the mind works. The causes of other types of prostatitis are normally not known. What are the signs or symptoms? Symptoms of this condition depend on the type of prostatitis you have. If your condition is caused by germs: You may feel pain or burning when you pee (urinate). You may pee often and all of a sudden. You may have problems starting to pee. You may have trouble emptying your bladder when you pee. You may have fever or chills. You may feel pain in your muscles, joints, low back, or lower belly. If you have other types of prostatitis: You may pee often or all of a sudden. You may have trouble starting to pee. You may have a weak flow when you pee. You may leak pee after using the bathroom. You may have other problems, such as: Abnormal fluid coming from the penis. Pain in the testicles or penis. Pain between the butt and the testicles. Pain when fluid comes out of the penis during sex. How is this treated? Treatment for this condition depends on the type of prostatitis. Treatment may include: Medicines. These may treat pain or swelling, or they may help relax muscles. Exercises to help you move better or get stronger (physical therapy). Heat therapy. Techniques to help  you control some of the ways that your body works. Exercises to help you relax. Antibiotic medicine, if your condition is caused by germs. Warm water baths (sitz baths) to relax muscles. Follow these instructions at home: Medicines Take over-the-counter and prescription medicines only as told by your doctor. If you were prescribed an antibiotic medicine, take it as told by your doctor. Do not stop using the antibiotic even if you start to feel better. Managing pain and swelling  Take sitz baths as told by your doctor. For a sitz bath, sit in warm water that is deep enough to cover your hips and butt. If told, put heat on the painful area. Do this as often as told by your doctor. Use the heat source that your doctor recommends, such as a moist heat pack or a heating pad. Place a towel between your skin and the heat source. Leave the heat on for 20-30 minutes. Take off the heat if your skin turns bright red. This is very important if you are unable to feel pain, heat, or cold. You may have a greater risk of getting burned. General instructions Do exercises as told by your doctor, if your doctor prescribed them. Keep all follow-up visits as told by your doctor. This is important. Where to find more information [employer] [facility] [employer] [facility] Digestive [employer] Kidney Diseases: [employer] Contact a doctor if: Your symptoms get worse. You have a fever. Get help right away if: You have chills. You feel light-headed. You feel like you may  faint. You cannot pee. You have blood or clumps of blood (blood clots) in your pee. Summary Prostatitis is swelling of the prostate gland. There are different types of prostatitis. Treatment depends on the type that you have. Take over-the-counter and prescription medicines only as told by your doctor. Get help right away of you have chills, feel light-headed, or feel like you may faint. Also get help right away if you cannot pee or you have  blood or clumps of blood in your pee. This information is not intended to replace advice given to you by your health care provider. Make sure you discuss any questions you have with your health care provider. Document Revised: 03/16/2022 Document Reviewed: 03/16/2022 Elsevier Patient Education  2024 [employer].

## 2024-01-23 NOTE — Progress Notes (Signed)
   01/23/24  CC: difficulty urinating  HPI: Mr Allston is a 28yo here for cystoscopy for difficulty urinating Blood pressure 135/68, pulse 73. NED. A&Ox3.   No respiratory distress   Abd soft, NT, ND Normal phallus with bilateral descended testicles  Cystoscopy Procedure Note  Patient identification was confirmed, informed consent was obtained, and patient was prepped using Betadine solution.  Lidocaine jelly was administered per urethral meatus.     Pre-Procedure: - Inspection reveals a normal caliber ureteral meatus.  Procedure: The flexible cystoscope was introduced without difficulty - No urethral strictures/lesions are present. - Normal prostate enema of prostatic urethra, erythematous - Normal bladder neck - Bilateral ureteral orifices identified - Bladder mucosa  reveals no ulcers, tumors, or lesions - No bladder stones - No trabeculation    Post-Procedure: - Patient tolerated the procedure well  Assessment/ Plan: Doxycyline 100mg  BID for 28 days Flomax  0.4mg  daily  No follow-ups on file.  Belvie Clara, MD

## 2024-01-24 LAB — MICROSCOPIC EXAMINATION: Bacteria, UA: NONE SEEN

## 2024-01-24 LAB — URINALYSIS, ROUTINE W REFLEX MICROSCOPIC
Bilirubin, UA: NEGATIVE
Glucose, UA: NEGATIVE
Ketones, UA: NEGATIVE
Nitrite, UA: NEGATIVE
Protein,UA: NEGATIVE
RBC, UA: NEGATIVE
Specific Gravity, UA: 1.015 (ref 1.005–1.030)
Urobilinogen, Ur: 1 mg/dL (ref 0.2–1.0)
pH, UA: 7 (ref 5.0–7.5)

## 2024-01-31 DIAGNOSIS — Z7251 High risk heterosexual behavior: Secondary | ICD-10-CM | POA: Diagnosis not present

## 2024-04-04 DIAGNOSIS — R399 Unspecified symptoms and signs involving the genitourinary system: Secondary | ICD-10-CM | POA: Diagnosis not present

## 2024-04-04 DIAGNOSIS — Z7251 High risk heterosexual behavior: Secondary | ICD-10-CM | POA: Diagnosis not present

## 2024-04-04 DIAGNOSIS — R300 Dysuria: Secondary | ICD-10-CM | POA: Diagnosis not present

## 2024-05-01 DIAGNOSIS — D72829 Elevated white blood cell count, unspecified: Secondary | ICD-10-CM | POA: Diagnosis not present

## 2024-05-01 DIAGNOSIS — D649 Anemia, unspecified: Secondary | ICD-10-CM | POA: Diagnosis not present

## 2024-06-04 ENCOUNTER — Ambulatory Visit: Admitting: Urology

## 2024-06-08 ENCOUNTER — Telehealth: Payer: Self-pay

## 2024-06-08 NOTE — Telephone Encounter (Signed)
 Patient called with no answer. Message left informing patient office will be closed 06/09/24 due to the inclement weather and that his appointment was rescheduled for the next available. Patient placed on wait list. Appointment mailed to patient.

## 2024-06-09 ENCOUNTER — Ambulatory Visit: Admitting: Urology

## 2024-12-22 ENCOUNTER — Ambulatory Visit: Admitting: Urology
